# Patient Record
Sex: Male | Born: 1970 | Race: White | Hispanic: No | Marital: Single | Smoking: Current every day smoker
Health system: Southern US, Community
[De-identification: ages and names within clinical notes are randomized; demographics above are authoritative.]

## PROBLEM LIST (undated history)

## (undated) DIAGNOSIS — I219 Acute myocardial infarction, unspecified: Secondary | ICD-10-CM

## (undated) DIAGNOSIS — I10 Essential (primary) hypertension: Secondary | ICD-10-CM

## (undated) HISTORY — PX: HERNIA REPAIR: SHX51

## (undated) HISTORY — PX: KNEE SURGERY: SHX244

---

## 1998-05-26 ENCOUNTER — Emergency Department (HOSPITAL_COMMUNITY): Admission: EM | Admit: 1998-05-26 | Discharge: 1998-05-26 | Payer: Self-pay | Admitting: *Deleted

## 2000-09-11 ENCOUNTER — Encounter: Payer: Self-pay | Admitting: Family Medicine

## 2000-09-11 ENCOUNTER — Encounter: Admission: RE | Admit: 2000-09-11 | Discharge: 2000-09-11 | Payer: Self-pay | Admitting: Family Medicine

## 2014-03-18 ENCOUNTER — Encounter (HOSPITAL_COMMUNITY): Payer: Self-pay | Admitting: Emergency Medicine

## 2014-03-18 ENCOUNTER — Emergency Department (HOSPITAL_COMMUNITY): Payer: BC Managed Care – PPO

## 2014-03-18 ENCOUNTER — Emergency Department (HOSPITAL_COMMUNITY)
Admission: EM | Admit: 2014-03-18 | Discharge: 2014-03-18 | Disposition: A | Discharge status: Home / Self Care | Payer: BC Managed Care – PPO | Attending: Emergency Medicine | Admitting: Emergency Medicine

## 2014-03-18 DIAGNOSIS — Z792 Long term (current) use of antibiotics: Secondary | ICD-10-CM | POA: Insufficient documentation

## 2014-03-18 DIAGNOSIS — M79609 Pain in unspecified limb: Secondary | ICD-10-CM | POA: Insufficient documentation

## 2014-03-18 DIAGNOSIS — K219 Gastro-esophageal reflux disease without esophagitis: Secondary | ICD-10-CM

## 2014-03-18 DIAGNOSIS — F17200 Nicotine dependence, unspecified, uncomplicated: Secondary | ICD-10-CM | POA: Insufficient documentation

## 2014-03-18 DIAGNOSIS — Z23 Encounter for immunization: Secondary | ICD-10-CM | POA: Insufficient documentation

## 2014-03-18 DIAGNOSIS — R079 Chest pain, unspecified: Secondary | ICD-10-CM

## 2014-03-18 LAB — BASIC METABOLIC PANEL
BUN: 11 mg/dL (ref 6–23)
CO2: 21 meq/L (ref 19–32)
Calcium: 9.1 mg/dL (ref 8.4–10.5)
Chloride: 103 mEq/L (ref 96–112)
Creatinine, Ser: 0.8 mg/dL (ref 0.50–1.35)
GFR calc non Af Amer: >90 mL/min (ref >90)
GLUCOSE: 87 mg/dL (ref 70–99)
POTASSIUM: 3.8 meq/L (ref 3.7–5.3)
Sodium: 138 mEq/L (ref 137–147)

## 2014-03-18 LAB — CBC
HEMATOCRIT: 44.2 % (ref 39.0–52.0)
HEMOGLOBIN: 15.3 g/dL (ref 13.0–17.0)
MCH: 33.9 pg (ref 26.0–34.0)
MCHC: 34.6 g/dL (ref 30.0–36.0)
MCV: 98 fL (ref 78.0–100.0)
Platelets: 245 10*3/uL (ref 150–400)
RBC: 4.51 MIL/uL (ref 4.22–5.81)
RDW: 14 % (ref 11.5–15.5)
WBC: 13 10*3/uL — AB (ref 4.0–10.5)

## 2014-03-18 LAB — HEPATIC FUNCTION PANEL
ALT: 22 U/L (ref 0–53)
AST: 19 U/L (ref 0–37)
Albumin: 4 g/dL (ref 3.5–5.2)
Alkaline Phosphatase: 81 U/L (ref 39–117)
BILIRUBIN TOTAL: 0.5 mg/dL (ref 0.3–1.2)
Bilirubin, Direct: <0.2 mg/dL (ref 0.0–0.3)
Total Protein: 6.8 g/dL (ref 6.0–8.3)

## 2014-03-18 LAB — URINALYSIS, ROUTINE W REFLEX MICROSCOPIC
Bilirubin Urine: NEGATIVE
GLUCOSE, UA: NEGATIVE mg/dL
Hgb urine dipstick: NEGATIVE
Ketones, ur: NEGATIVE mg/dL
LEUKOCYTES UA: NEGATIVE
NITRITE: NEGATIVE
Protein, ur: NEGATIVE mg/dL
SPECIFIC GRAVITY, URINE: 1.011 (ref 1.005–1.030)
Urobilinogen, UA: 0.2 mg/dL (ref 0.0–1.0)
pH: 6 (ref 5.0–8.0)

## 2014-03-18 LAB — I-STAT TROPONIN, ED: Troponin i, poc: 0 ng/mL (ref 0.00–0.08)

## 2014-03-18 LAB — TROPONIN I

## 2014-03-18 LAB — PRO B NATRIURETIC PEPTIDE: Pro B Natriuretic peptide (BNP): 14.1 pg/mL (ref 0–125)

## 2014-03-18 LAB — LIPASE, BLOOD: LIPASE: 26 U/L (ref 11–59)

## 2014-03-18 MED ORDER — GI COCKTAIL ~~LOC~~
30.0000 mL | Freq: Once | ORAL | Status: AC
  Administered 2014-03-18: 30 mL via ORAL
  Filled 2014-03-18: qty 30

## 2014-03-18 MED ORDER — TETANUS-DIPHTH-ACELL PERTUSSIS 5-2.5-18.5 LF-MCG/0.5 IM SUSP
0.5000 mL | Freq: Once | INTRAMUSCULAR | Status: AC
  Administered 2014-03-18: 0.5 mL via INTRAMUSCULAR
  Filled 2014-03-18: qty 0.5

## 2014-03-18 MED ORDER — OMEPRAZOLE 20 MG PO CPDR: 20.0000 mg | DELAYED_RELEASE_CAPSULE | Freq: Every day | ORAL | Status: DC

## 2014-03-18 MED ORDER — ASPIRIN 325 MG PO TABS
325.0000 mg | ORAL_TABLET | Freq: Once | ORAL | Status: AC
  Administered 2014-03-18: 325 mg via ORAL
  Filled 2014-03-18: qty 1

## 2014-03-18 MED ORDER — SODIUM CHLORIDE 0.9 % IV BOLUS (SEPSIS)
1000.0000 mL | Freq: Once | INTRAVENOUS | Status: AC
  Administered 2014-03-18: 1000 mL via INTRAVENOUS

## 2014-03-18 MED ORDER — ONDANSETRON HCL 4 MG/2ML IJ SOLN
4.0000 mg | Freq: Once | INTRAMUSCULAR | Status: DC
  Filled 2014-03-18: qty 2

## 2014-03-18 MED ORDER — MORPHINE SULFATE 2 MG/ML IJ SOLN
2.0000 mg | Freq: Once | INTRAMUSCULAR | Status: DC
  Filled 2014-03-18: qty 1

## 2014-03-18 NOTE — ED Notes (Signed)
Spoke with Lab, stated they did not add on bloodwork, will have to redrawn due to time being too late

## 2014-03-18 NOTE — ED Notes (Signed)
Per pt sts chest pain that started last night that has been constant. sts that the pain has gotten worse and is radiating into his arm and having a HA.

## 2014-03-18 NOTE — ED Provider Notes (Signed)
CSN: [id number]     Arrival date & time 03/18/14  1344 History   First MD Initiated Contact with Patient 03/18/14 1506     Chief Complaint  Patient presents with  . Chest Pain     (Consider location/radiation/quality/duration/timing/severity/associated sxs/prior Treatment) The history is provided by the patient. No language interpreter was used.  Brett Odom is a 43 y/o M with no known significant PMHx presenting to the ED with chest pain that started last night while the patient was watching television, around 10:00-10:30PM. Patient erported that the chest pain is localized to the epigastric region and center of the chest described as an intense intermittent burning sensation, described as an "intense heartburn." Patient reported that this afternoon he started to experience left arm tingling and numbness. Reported that he has been feeling nauseous intermittently. Stated that he has history of GERD - reported that when he wakes up in the morning he drinks a [facility] that makes the pain worse and stated that when he drinks water he does better. 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 Denied fevers, shortness of breath, difficulty breathing, neck pain, neck stiffness, diarrhea, melena, hematochezia, diaphoresis, changes in appetite, syncope. Family history of cardiac issues on the maternal side - myocardial infarction when mother was in her 86s-as per patient report. PCP none  History reviewed. No pertinent past medical history. History reviewed. No pertinent past surgical history. History reviewed. No pertinent family history. History  Substance Use Topics  . Smoking status: Current Every Day Smoker  . Smokeless tobacco: Not on file  . Alcohol Use: Yes    Review of Systems  Constitutional: Negative for fever and chills.  Respiratory: Negative for chest tightness and shortness of breath.   Cardiovascular: Positive for chest pain.  Gastrointestinal: Positive for nausea. Negative for  vomiting, abdominal pain, diarrhea, constipation, blood in stool and anal bleeding.  Musculoskeletal: Positive for arthralgias (Left arm).  Neurological: Negative for dizziness, weakness and headaches.  All other systems reviewed and are negative.     Allergies  Codeine  Home Medications   Prior to Admission medications   Medication Sig Start Date End Date Taking? Authorizing Provider  amoxicillin-clavulanate (AUGMENTIN) 875-125 MG per tablet Take 1 tablet by mouth 2 (two) times daily. Started 03/07/14   Yes Historical Provider, MD   BP 150/91  Pulse 61  Temp(Src) 97.9 F (36.6 C) (Oral)  Resp 14  Wt 210 lb (95.255 kg)  SpO2 99% Physical Exam  Nursing note and vitals reviewed. Constitutional: He is oriented to person, place, and time. He appears well-developed and well-nourished. No distress.  HENT:  Head: Normocephalic and atraumatic.  Mouth/Throat: Oropharynx is clear and moist. No oropharyngeal exudate.  Eyes: Conjunctivae and EOM are normal. Pupils are equal, round, and reactive to light. Right eye exhibits no discharge. Left eye exhibits no discharge.  Neck: Normal range of motion. Neck supple. No tracheal deviation present.  Negative neck stiffness Negative nuchal rigidity Negative cervical lymphadenopathy Negative meningeal signs  Cardiovascular: Normal rate, regular rhythm and normal heart sounds.  Exam reveals no friction rub.   No murmur heard. Pulses:      Radial pulses are 2+ on the right side, and 2+ on the left side.       Dorsalis pedis pulses are 2+ on the right side, and 2+ on the left side.  Cap refill less than 3 seconds Negative swelling or pitting edema identified to lower extremities bilaterally  Pulmonary/Chest: Effort normal and breath  sounds normal. No respiratory distress. He has no wheezes. He has no rales. He exhibits tenderness.  Patient is able to speak in full sentences without difficulty Negative use of accessory muscles Negative  stridor  Discomfort upon palpation to the Center for chest - pain reproducible  Abdominal: Soft. Bowel sounds are normal. He exhibits no distension. There is tenderness. There is no rebound and no guarding.  Abdomen with negative distention Soft upon palpation Discomfort upon palpation to the right upper quadrant and epigastric region Negative rigidity or guarding noted Negative peritoneal signs  Musculoskeletal: Normal range of motion.  Full ROM to upper and lower extremities without difficulty noted, negative ataxia noted.  Lymphadenopathy:    He has no cervical adenopathy.  Neurological: He is alert and oriented to person, place, and time. No cranial nerve deficit. He exhibits normal muscle tone. Coordination normal.  Cranial nerves III-XII grossly intact Strength 5+/5+ to upper and lower extremities bilaterally with resistance applied, equal distribution noted Equal grip strength  Skin: Skin is warm and dry. No rash noted. He is not diaphoretic. No erythema.  Psychiatric: He has a normal mood and affect. His behavior is normal. Thought content normal.    ED Course  Procedures (including critical care time)  8:11 PM This provider re-assessed the patient. Reported that the GI cocktail helped a little bit, reported that he still is experiencing discomfort described as a burning, gnawing sensation - rated the discomfort as a 4/10. This provider discussed labs and imaging in great detail with patient and family at bedside-patient understood. Patient reported that he drinks soda, drinks coffee, eats [employer] on a daily basis-discussed with patient that this is not a proper diet and this is most likely GERD related. Patient understood.  Results for orders placed during the hospital encounter of 03/18/14  CBC      Result Value Ref Range   WBC 13.0 (*) 4.0 - 10.5 K/uL   RBC 4.51  4.22 - 5.81 MIL/uL   Hemoglobin 15.3  13.0 - 17.0 g/dL   HCT 40.944.2  81.139.0 - 91.452.0 %   MCV 98.0  78.0 - 100.0 fL    MCH 33.9  26.0 - 34.0 pg   MCHC 34.6  30.0 - 36.0 g/dL   RDW 78.214.0  95.611.5 - 21.315.5 %   Platelets 245  150 - 400 K/uL  BASIC METABOLIC PANEL      Result Value Ref Range   Sodium 138  137 - 147 mEq/L   Potassium 3.8  3.7 - 5.3 mEq/L   Chloride 103  96 - 112 mEq/L   CO2 21  19 - 32 mEq/L   Glucose, Bld 87  70 - 99 mg/dL   BUN 11  6 - 23 mg/dL   Creatinine, Ser 0.860.80  0.50 - 1.35 mg/dL   Calcium 9.1  8.4 - 57.810.5 mg/dL   GFR calc non Af Amer >90  >90 mL/min   GFR calc Af Amer >90  >90 mL/min  LIPASE, BLOOD      Result Value Ref Range   Lipase 26  11 - 59 U/L  URINALYSIS, ROUTINE W REFLEX MICROSCOPIC      Result Value Ref Range   Color, Urine YELLOW  YELLOW   APPearance CLEAR  CLEAR   Specific Gravity, Urine 1.011  1.005 - 1.030   pH 6.0  5.0 - 8.0   Glucose, UA NEGATIVE  NEGATIVE mg/dL   Hgb urine dipstick NEGATIVE  NEGATIVE   Bilirubin Urine NEGATIVE  NEGATIVE   Ketones, ur NEGATIVE  NEGATIVE mg/dL   Protein, ur NEGATIVE  NEGATIVE mg/dL   Urobilinogen, UA 0.2  0.0 - 1.0 mg/dL   Nitrite NEGATIVE  NEGATIVE   Leukocytes, UA NEGATIVE  NEGATIVE  PRO B NATRIURETIC PEPTIDE      Result Value Ref Range   Pro B Natriuretic peptide (BNP) 14.1  0 - 125 pg/mL  HEPATIC FUNCTION PANEL      Result Value Ref Range   Total Protein 6.8  6.0 - 8.3 g/dL   Albumin 4.0  3.5 - 5.2 g/dL   AST 19  0 - 37 U/L   ALT 22  0 - 53 U/L   Alkaline Phosphatase 81  39 - 117 U/L   Total Bilirubin 0.5  0.3 - 1.2 mg/dL   Bilirubin, Direct <1.6  0.0 - 0.3 mg/dL   Indirect Bilirubin NOT CALCULATED  0.3 - 0.9 mg/dL  TROPONIN I      Result Value Ref Range   Troponin I <0.30  <0.30 ng/mL  I-STAT TROPOININ, ED      Result Value Ref Range   Troponin i, poc 0.00  0.00 - 0.08 ng/mL   Comment 3             Labs Review Labs Reviewed  CBC - Abnormal; Notable for the following:    WBC 13.0 (*)    All other components within normal limits  BASIC METABOLIC PANEL  LIPASE, BLOOD  URINALYSIS, ROUTINE W REFLEX MICROSCOPIC   PRO B NATRIURETIC PEPTIDE  HEPATIC FUNCTION PANEL  TROPONIN I  Rosezena Sensor, ED    Imaging Review Dg Chest 2 View  03/18/2014   CLINICAL DATA:  Chest pain  EXAM: CHEST  2 VIEW  COMPARISON:  None.  FINDINGS: Cardiomediastinal silhouette is unremarkable. No acute infiltrate or pleural effusion. No pulmonary edema. Bony thorax is unremarkable.  IMPRESSION: No active cardiopulmonary disease.   Electronically Signed   By: Natasha Mead M.D.   On: 03/18/2014 14:45   US Abdomen Complete  03/18/2014   CLINICAL DATA:  Right upper quadrant pain  EXAM: ULTRASOUND ABDOMEN COMPLETE  COMPARISON:  None.  FINDINGS: Gallbladder:  No gallstones or wall thickening visualized. No sonographic Murphy sign noted.  Common bile duct:  Diameter: 3 mm  Liver:  No focal lesion identified. Within normal limits in parenchymal echogenicity.  IVC:  No abnormality visualized.  Pancreas:  Poorly visualized due to overlying bowel gas.  Spleen:  Size and appearance within normal limits.  Right Kidney:  Length: 13.1 cm. Echogenicity within normal limits. No mass or hydronephrosis visualized.  Left Kidney:  Length: 13.0 cm. Echogenicity within normal limits. No mass or hydronephrosis visualized.  Abdominal aorta:  No aneurysm visualized.  Other findings:  None.  IMPRESSION: Unremarkable abdominal ultrasound.   Electronically Signed   By: Annia Belt M.D.   On: 03/18/2014 17:49     EKG Interpretation   Date/Time:  Friday Mar 18 2014 13:47:21 EDT Ventricular Rate:  78 PR Interval:  154 QRS Duration: 102 QT Interval:  366 QTC Calculation: 417 R Axis:   63 Text Interpretation:  Normal sinus rhythm Normal ECG no previous for  comparison Confirmed by HARRISON  MD, FORREST ([id number]) on 03/18/2014 4:02:38  PM      MDM   Final diagnoses:  GERD (gastroesophageal reflux disease)  Chest pain   Medications  aspirin tablet 325 mg (325 mg Oral Given 03/18/14 1611)  sodium chloride 0.9 % bolus 1,000 mL (0  mLs Intravenous Stopped  03/18/14 1855)  Tdap (BOOSTRIX) injection 0.5 mL (0.5 mLs Intramuscular Given 03/18/14 1655)  gi cocktail (Maalox,Lidocaine,Donnatal) (30 mLs Oral Given 03/18/14 1901)   Filed Vitals:   03/18/14 1645 03/18/14 1830 03/18/14 1842 03/18/14 2000  BP: 132/92 143/83 132/87 150/91  Pulse: 83 68  61  Temp:   97.9 F (36.6 C)   TempSrc:   Oral   Resp: 18 17 14 14   Weight:      SpO2: 99% 96% 98% 99%    EKG noted normal sinus rhythm with heart rate of 78 beats per minute. First troponin negative elevation. Second troponin negative elevation. CBC noted mild elevated white blood cell count 13.0. BMP negative findings-kidney function well. Chest x-ray negative for acute cardiopulmonary disease. Abdominal ultrasound unremarkable-negative gallstones or wall thickening identified. HEART score of 2. TIMI score 0. Well's score for PE 0 - doubt PE. Doubt pancreatitis. Doubt acute cholecystitis or cholangitis. Suspicion to be GERD secondary to patient's poor diet of coffee, carbonated drinks, and processed foods. Patient seen and assessed by attending physician who cleared patient for discharge. Patient not septic appearing. Patient stable, afebrile. Discharged patient. Discharged patient with PPIs. Referred patient to PCP, cardiology, and GI. Discussed with patient proper diet. Discussed with patient to rest and stay hydrated. Discussed with patient that if symptoms do not improve within 24-48 hours that patient is to report back to the ED. Discussed with patient to closely monitor symptoms and if symptoms are to worsen or change to report back to the ED - strict return instructions given.  Patient agreed to plan of care, understood, all questions answered.   Raymon Mutton, PA-C 03/19/14 1038

## 2014-03-18 NOTE — ED Notes (Signed)
Pt transported to US

## 2014-03-18 NOTE — Discharge Instructions (Signed)
Please call your doctor for a followup appointment within 24-48 hours. When you talk to your doctor please let them know that you were seen in the emergency department and have them acquire all of your records so that they can discuss the findings with you and formulate a treatment plan to fully care for your new and ongoing problems. Please call and set-up an appointment with your primary care provider to be seen and re-assessed within 24-48 hours Please call and set-up an appointment with Cardiology and Gastroenterology Please decrease coffee intake, soda intake, pepperoni intake - decrease fat and greasy food intake Please rest and stay hydrated - please drink plenty of water If symptoms do not improve please report back to the ED immediately  Please follow diet below Please continue to monitor symptoms closely and if symptoms are to worsen or change (fever greater than 101, chills, sweating, nausea, vomiting, chest pain, shortness of breath, difficulty breathing, numbness, tingling, loss of sensation, dizziness, fainting, worsening or changes to chest pain, stomach pain) please report back to the ED immediately   Chest Pain (Nonspecific) It is often hard to give a specific diagnosis for the cause of chest pain. There is always a chance that your pain could be related to something serious, such as a heart attack or a blood clot in the lungs. You need to follow up with your caregiver for further evaluation. CAUSES   Heartburn.  Pneumonia or bronchitis.  Anxiety or stress.  Inflammation around your heart (pericarditis) or lung (pleuritis or pleurisy).  A blood clot in the lung.  A collapsed lung (pneumothorax). It can develop suddenly on its own (spontaneous pneumothorax) or from injury (trauma) to the chest.  Shingles infection (herpes zoster virus). The chest wall is composed of bones, muscles, and cartilage. Any of these can be the source of the pain.  The bones can be bruised by  injury.  The muscles or cartilage can be strained by coughing or overwork.  The cartilage can be affected by inflammation and become sore (costochondritis). DIAGNOSIS  Lab tests or other studies, such as X-rays, electrocardiography, stress testing, or cardiac imaging, may be needed to find the cause of your pain.  TREATMENT   Treatment depends on what may be causing your chest pain. Treatment may include:  Acid blockers for heartburn.  Anti-inflammatory medicine.  Pain medicine for inflammatory conditions.  Antibiotics if an infection is present.  You may be advised to change lifestyle habits. This includes stopping smoking and avoiding alcohol, caffeine, and chocolate.  You may be advised to keep your head raised (elevated) when sleeping. This reduces the chance of acid going backward from your stomach into your esophagus.  Most of the time, nonspecific chest pain will improve within 2 to 3 days with rest and mild pain medicine. HOME CARE INSTRUCTIONS   If antibiotics were prescribed, take your antibiotics as directed. Finish them even if you start to feel better.  For the next few days, avoid physical activities that bring on chest pain. Continue physical activities as directed.  Do not smoke.  Avoid drinking alcohol.  Only take over-the-counter or prescription medicine for pain, discomfort, or fever as directed by your caregiver.  Follow your caregiver's suggestions for further testing if your chest pain does not go away.  Keep any follow-up appointments you made. If you do not go to an appointment, you could develop lasting (chronic) problems with pain. If there is any problem keeping an appointment, you must call to reschedule.  SEEK MEDICAL CARE IF:   You think you are having problems from the medicine you are taking. Read your medicine instructions carefully.  Your chest pain does not go away, even after treatment.  You develop a rash with blisters on your  chest. SEEK IMMEDIATE MEDICAL CARE IF:   You have increased chest pain or pain that spreads to your arm, neck, jaw, back, or abdomen.  You develop shortness of breath, an increasing cough, or you are coughing up blood.  You have severe back or abdominal pain, feel nauseous, or vomit.  You develop severe weakness, fainting, or chills.  You have a fever. THIS IS AN EMERGENCY. Do not wait to see if the pain will go away. Get medical help at once. Call your local emergency services (911 in U.S.). Do not drive yourself to the hospital. MAKE SURE YOU:   Understand these instructions.  Will watch your condition.  Will get help right away if you are not doing well or get worse. Document Released: 07/24/2005 Document Revised: 01/06/2012 Document Reviewed: 05/19/2008 [facility] Patient Information 2014 [place], [place]. Diet for Gastroesophageal Reflux Disease, Adult Reflux is when stomach acid flows up into the esophagus. The esophagus becomes irritated and sore (inflammation). When reflux happens often and is severe, it is called gastroesophageal reflux disease (GERD). What you eat can help ease any discomfort caused by GERD. FOODS OR DRINKS TO AVOID OR LIMIT  Coffee and black tea, with or without caffeine.  Bubbly (carbonated) drinks with caffeine or energy drinks.  Strong spices, such as pepper, cayenne pepper, curry, or chili powder.  Peppermint or spearmint.  Chocolate.  High-fat foods, such as meats, fried food, oils, butter, or nuts.  Fruits and vegetables that cause discomfort. This includes citrus fruits and tomatoes.  Alcohol. If a certain food or drink irritates your GERD, avoid eating or drinking it. THINGS THAT MAY HELP GERD INCLUDE:  Eat meals slowly.  Eat 5 to 6 small meals a day, not 3 large meals.  Do not eat food for a certain amount of time if it causes discomfort.  Wait 3 hours after eating before lying down.  Keep the head of your bed raised 6 to 9 inches  (15 23 centimeters). Put a foam wedge or blocks under the legs of the bed.  Stay active. Weight loss, if needed, may help ease your discomfort.  Wear loose-fitting clothing.  Do not smoke or chew tobacco. Document Released: 04/14/2012 Document Reviewed: 04/14/2012 [facility] Patient Information 2014 [place], [place]. Gastroesophageal Reflux Disease, Adult Gastroesophageal reflux disease (GERD) happens when acid from your stomach flows up into the esophagus. When acid comes in contact with the esophagus, the acid causes soreness (inflammation) in the esophagus. Over time, GERD may create small holes (ulcers) in the lining of the esophagus. CAUSES   Increased body weight. This puts pressure on the stomach, making acid rise from the stomach into the esophagus.  Smoking. This increases acid production in the stomach.  Drinking alcohol. This causes decreased pressure in the lower esophageal sphincter (valve or ring of muscle between the esophagus and stomach), allowing acid from the stomach into the esophagus.  Late evening meals and a full stomach. This increases pressure and acid production in the stomach.  A malformed lower esophageal sphincter. Sometimes, no cause is found. SYMPTOMS   Burning pain in the lower part of the mid-chest behind the breastbone and in the mid-stomach area. This may occur twice a week or more often.  Trouble swallowing.  Sore  throat.  Dry cough.  Asthma-like symptoms including chest tightness, shortness of breath, or wheezing. DIAGNOSIS  Your caregiver may be able to diagnose GERD based on your symptoms. In some cases, X-rays and other tests may be done to check for complications or to check the condition of your stomach and esophagus. TREATMENT  Your caregiver may recommend over-the-counter or prescription medicines to help decrease acid production. Ask your caregiver before starting or adding any new medicines.  HOME CARE INSTRUCTIONS   Change the  factors that you can control. Ask your caregiver for guidance concerning weight loss, quitting smoking, and alcohol consumption.  Avoid foods and drinks that make your symptoms worse, such as:  Caffeine or alcoholic drinks.  Chocolate.  Peppermint or mint flavorings.  Garlic and onions.  Spicy foods.  Citrus fruits, such as oranges, lemons, or limes.  Tomato-based foods such as sauce, chili, salsa, and pizza.  Fried and fatty foods.  Avoid lying down for the 3 hours prior to your bedtime or prior to taking a nap.  Eat small, frequent meals instead of large meals.  Wear loose-fitting clothing. Do not wear anything tight around your waist that causes pressure on your stomach.  Raise the head of your bed 6 to 8 inches with wood blocks to help you sleep. Extra pillows will not help.  Only take over-the-counter or prescription medicines for pain, discomfort, or fever as directed by your caregiver.  Do not take aspirin, ibuprofen, or other nonsteroidal anti-inflammatory drugs (NSAIDs). SEEK IMMEDIATE MEDICAL CARE IF:   You have pain in your arms, neck, jaw, teeth, or back.  Your pain increases or changes in intensity or duration.  You develop nausea, vomiting, or sweating (diaphoresis).  You develop shortness of breath, or you faint.  Your vomit is green, yellow, black, or looks like coffee grounds or blood.  Your stool is red, bloody, or black. These symptoms could be signs of other problems, such as heart disease, gastric bleeding, or esophageal bleeding. MAKE SURE YOU:   Understand these instructions.  Will watch your condition.  Will get help right away if you are not doing well or get worse. Document Released: 07/24/2005 Document Revised: 01/06/2012 Document Reviewed: 05/03/2011 [facility] Patient Information 2014 [place], [place].   Emergency Department Resource Guide 1) Find a Doctor and Pay Out of Pocket Although you won't have to find out who is covered by  your insurance plan, it is a good idea to ask around and get recommendations. You will then need to call the office and see if the doctor you have chosen will accept you as a new patient and what types of options they offer for patients who are self-pay. Some doctors offer discounts or will set up payment plans for their patients who do not have insurance, but you will need to ask so you aren't surprised when you get to your appointment.  2) Contact Your Local Health Department Not all health departments have doctors that can see patients for sick visits, but many do, so it is worth a call to see if yours does. If you don't know where your local health department is, you can check in your phone book. The CDC also has a tool to help you locate your state's health department, and many state websites also have listings of all of their local health departments.  3) Find a Walk-in Clinic If your illness is not likely to be very severe or complicated, you may want to try a walk in clinic.  These are popping up all over the country in pharmacies, drugstores, and shopping centers. They're usually staffed by nurse practitioners or physician assistants that have been trained to treat common illnesses and complaints. They're usually fairly quick and inexpensive. However, if you have serious medical issues or chronic medical problems, these are probably not your best option.  No Primary Care Doctor: - Call Health Connect at  [phone] - they can help you locate a primary care doctor that  accepts your insurance, provides certain services, etc. - Physician Referral Service- [phone]  Chronic Pain Problems: Organization         Address  Phone   Notes  [facility]  [phone]) [phone] Patients need to be referred by their primary care doctor.   Medication Assistance: Organization         Address  Phone   Notes  [facility] Medication [facility] [address] [place], [place] [address] [phone] --Must be a resident of [facility] [place] -- Must have NO insurance coverage whatsoever (no Medicaid/ Medicare, etc.) -- The pt. MUST have a primary care doctor that directs their care regularly and follows them in the community   MedAssist  [phone]   [employer]  [phone]) [phone]    Agencies that provide inexpensive medical care: Organization         Address  Phone   Notes  [facility]  [phone]) [phone]   [facility]    [phone]) [phone]   [facility] [address] [place], [place] [address] [phone]) [phone]   [facility] [address], [place] [phone]   [facility]anned Parenthood    [phone]   [facility]    [phone]) [phone]   [facility]  [address], [place] Phone:  [phone], Fax:  [phone] Hours of Operation:  9 am - 6 pm, M-F.  Also accepts Medicaid/Medicare and self-pay.  [facility]  [address], [place] Phone: [phone]) [phone], Fax: [phone]) [phone]. Hours of Operation:  8:30 am - 5:30 pm, M-F.  Also accepts Medicaid and self-pay.  [facility] [place] [address], [place] Phone: [phone]   [facility] [address], [place], [place] [phone], Ext. [phone] Mondays & Thursdays: 7-9 AM.  First 15 patients are seen on a first come, first serve basis.    Medicaid-accepting [facility] Providers:  Organization         Address  Phone   Notes  [facility] [address], [place] [phone] Also accepts self-pay patients.  [facility] [address], [place]  [phone]   [facility] [address], [place] [phone]) [phone]   [facility] [address] [place] [address], [place] [phone]   Renaye RakersVeita Bland [address], [place]   [phone] Only accepts [place] Access [place] patients after they have their name applied to their card.   Self-Pay (no insurance) in [facility] [place]:  Organization         Address  Phone   Notes  Sickle Cell Patients, [facility] [address], [place] [phone]   [facility] [address], [place] [phone]   [facility]  [place]  [address], [place] [phone]   [facility]/Dr. Osei-Bonsu  [address] [place] [address], [place] or [address], [place] [phone]) [phone] Phone number for both [place] and [place] locations is the same.  [facility] [address], [place] [phone]) [phone]   [facility] [place] [address] [place] [address], [place] or [address] [phone]   [facility] [address], [place] [phone], phone; [phone]) [phone], fax Sees patients 1st and 3rd Saturday of every month.  Must not qualify for public or private insurance (i.e. Medicaid, Medicare, [place] Health Choice, Veterans' Benefits)  Household income should be no more than 200% of the poverty level The clinic cannot treat you if you are pregnant or think you are pregnant  Sexually transmitted diseases are not treated at the clinic.    Dental Care: Organization         Address  Phone  Notes  [facility] Department of [facility] [address], [place] [phone]) [phone] Accepts children up to age 42 who are enrolled in [place] or [place] Health Choice; pregnant women with a Medicaid card; and children who have applied for Medicaid or [place] Health Choice, but were declined, whose parents can pay a reduced fee at time of service.  [facility] of [facility] [place]  [address], [place] [phone]) [phone] Accepts children up to age 42 who are enrolled  in [place] or [place] Health Choice; pregnant women with a Medicaid card; and children who have applied for Medicaid or [place] Health Choice, but were declined, whose parents can pay a reduced fee at time of service.  [facility]  [address], [place] [phone] Patients are seen by appointment only. Walk-ins are not accepted. [facility] will see patients 42 years of age and older. Monday - Tuesday (8am-5pm) Most Wednesdays (8:30-5pm) $30 per visit, cash only  [facility]  [address], [facility] [phone] Patients are seen by appointment only. Walk-ins are not accepted. [facility] will see patients 42 years of age and older. One Wednesday Evening (Monthly: Volunteer Based).  $30 per visit, cash only  [facility]  [phone] for adults; Children under age 604, call Graduate Pediatric Dentistry at [phone]. Children aged 204-14, please call [phone] to request a pediatric application.  Dental services are provided in all areas of dental care including fillings, crowns and bridges, complete and partial dentures, implants, gum treatment, root canals, and extractions. Preventive care is also provided. Treatment is provided to both adults and children. Patients are selected via a lottery and there is often a waiting list.   [facility] [address], [place]  [phone] [email or web address]   [facility] [address], [place], [place] [phone], Ext. [phone] Second and Fourth Thursday of each month, opens at 6:30 AM; Clinic ends at 9 AM.  Patients are seen on a first-come first-served basis, and a limited number are seen during each clinic.   [facility]  [address], [place], [place] [phone]) [phone]   Eligibility Requirements You must have lived in [place], [place], or [place] counties for at least the last three months.   You cannot be  eligible for state or federal sponsored National Cityhealthcare insurance, including [employer]  Administration, Medicaid, or Medicare.   You generally cannot be eligible for healthcare insurance through your employer.    How to apply: Eligibility screenings are held every Tuesday and Wednesday afternoon from 1:00 pm until 4:00 pm. You do not need an appointment for the interview!  [facility] [address] [place], [place] [phone]   [facility] Health Department  [phone]   [place] [facility] [facility] Health Department  [phone]   [facility] Health Department  [phone]    Behavioral Health Resources in the Community: Intensive Outpatient Programs Organization         Address  Phone  Notes  [facility] [address] [place], [place] [phone]   [facility] [address], [place], [place] [phone]   [facility]: [facility] [address], [place], [place]  [phone]   [facility] Mental [facility] [address],  [place], [place] [phone] or [phone]   Substance Abuse Resources Organization         Address  Phone  Notes  Alcohol and Drug Services  [phone]   [facility]  [phone]   [facility] [place]  [phone]   Floydene Flock  [phone]   Residential & Outpatient Substance Abuse Program  [phone]   Psychological Services Organization         Address  Phone  Notes  [facility]  [phone]   [facility]  [phone]   [facility] Mental Health [address], [place] [phone] or [phone]    Mobile Crisis Teams Organization         Address  Phone  Notes  [facility], Mobile Crisis Care Unit  [phone]   [facility]  [address] [place], [place] [phone]   Doristine Locks [address] [place] [phone]    Self-Help/Support Groups Organization          Address  Phone             Notes  [facility] [place] - variety of support groups  [phone]- I7437963 Call for more information  Narcotics Anonymous (NA), [facility] [address], [employer] [place]  2 meetings at this location   Statistician         Address  Phone  Notes  [facility] [address],    [place]  [phone]   [facility]  [address], [place] [address], [place], [place] [phone]   [facility] [address] [place], [place] [phone] Admissions: 8am-3pm M-F  [facility] [address],    [place], [place] [phone]   [facility] [address] [place], [place], [place] [phone]   [facility] [address]  [place], [place] [phone]   [facility] - Intensive Outpatient [address], [place], [place] [phone]   [facility] ([facility]iction [facility] [address]  [place], [place] [phone] or [phone]   [facility] ([facility]) [address], [place], [place] [phone] Accepts Medicaid  [facility] [address],  [place] [phone] Substance Abuse/Addiction Treatment   [facility] Organization         Address  Phone  Notes  [facility]  [phone]   Angie Fava, PhD [address] [place], [place]   [phone] or [phone]  [facility]   [address] [place], [place] [phone]   [facility] [facility] [address], [place], [place] [phone] Insurance/Medicaid/sponsorship through [facility] [address], [address]                                    [place], [place] [phone] Therapy/tele-psych/case  [facility] [address]   [place], [place] [phone]    Dr. Lolly Mustache  [phone]   [facility]  [employer]  [facility] Dept. 1) [address], [place] 2) [address], [place] 3)  [address], [place] [phone]  [phone]   [facility] Child Abuse Hotline [phone] or [phone] (After Hours)

## 2014-03-18 NOTE — ED Notes (Signed)
Spoke with lab, stated they would add on blood work.

## 2014-03-18 NOTE — ED Notes (Signed)
Patient returned from US.

## 2014-03-18 NOTE — ED Notes (Signed)
IV removed from right hand   Cath intact.  Patient stated he is ready to go.

## 2014-03-19 NOTE — ED Provider Notes (Signed)
Medical screening examination/treatment/procedure(s) were conducted as a shared visit with non-physician practitioner(s) and myself.  I personally evaluated the patient during the encounter.   EKG Interpretation   Date/Time:  Friday Mar 18 2014 13:47:21 EDT Ventricular Rate:  78 PR Interval:  154 QRS Duration: 102 QT Interval:  366 QTC Calculation: 417 R Axis:   63 Text Interpretation:  Normal sinus rhythm Normal ECG no previous for  comparison Confirmed by Eugenie Harewood  MD, Amyria Komar ([id number]) on 03/18/2014 4:02:38  PM      I interviewed and examined the patient. Lungs are CTAB. Cardiac exam wnl. Abdomen soft w/ mild ttp of epig/RUQ area.  Pain atypical for cardiac, suspicious for gi cause. Labs/US non-contrib. Pt continues to appear well. Delta trop neg. Low risk for MACE per HEART score. Will start ppi vs H2 blocker. Will rec f/u w/ pcp.   Junius Argyle, MD 03/19/14 1233

## 2014-11-30 IMAGING — CR DG CHEST 2V
2 series · 2 of 2 positions shown · non-contrast
Comparison: None.

CLINICAL DATA: Chest pain

EXAM:
CHEST  2 VIEW

[w chest pa]
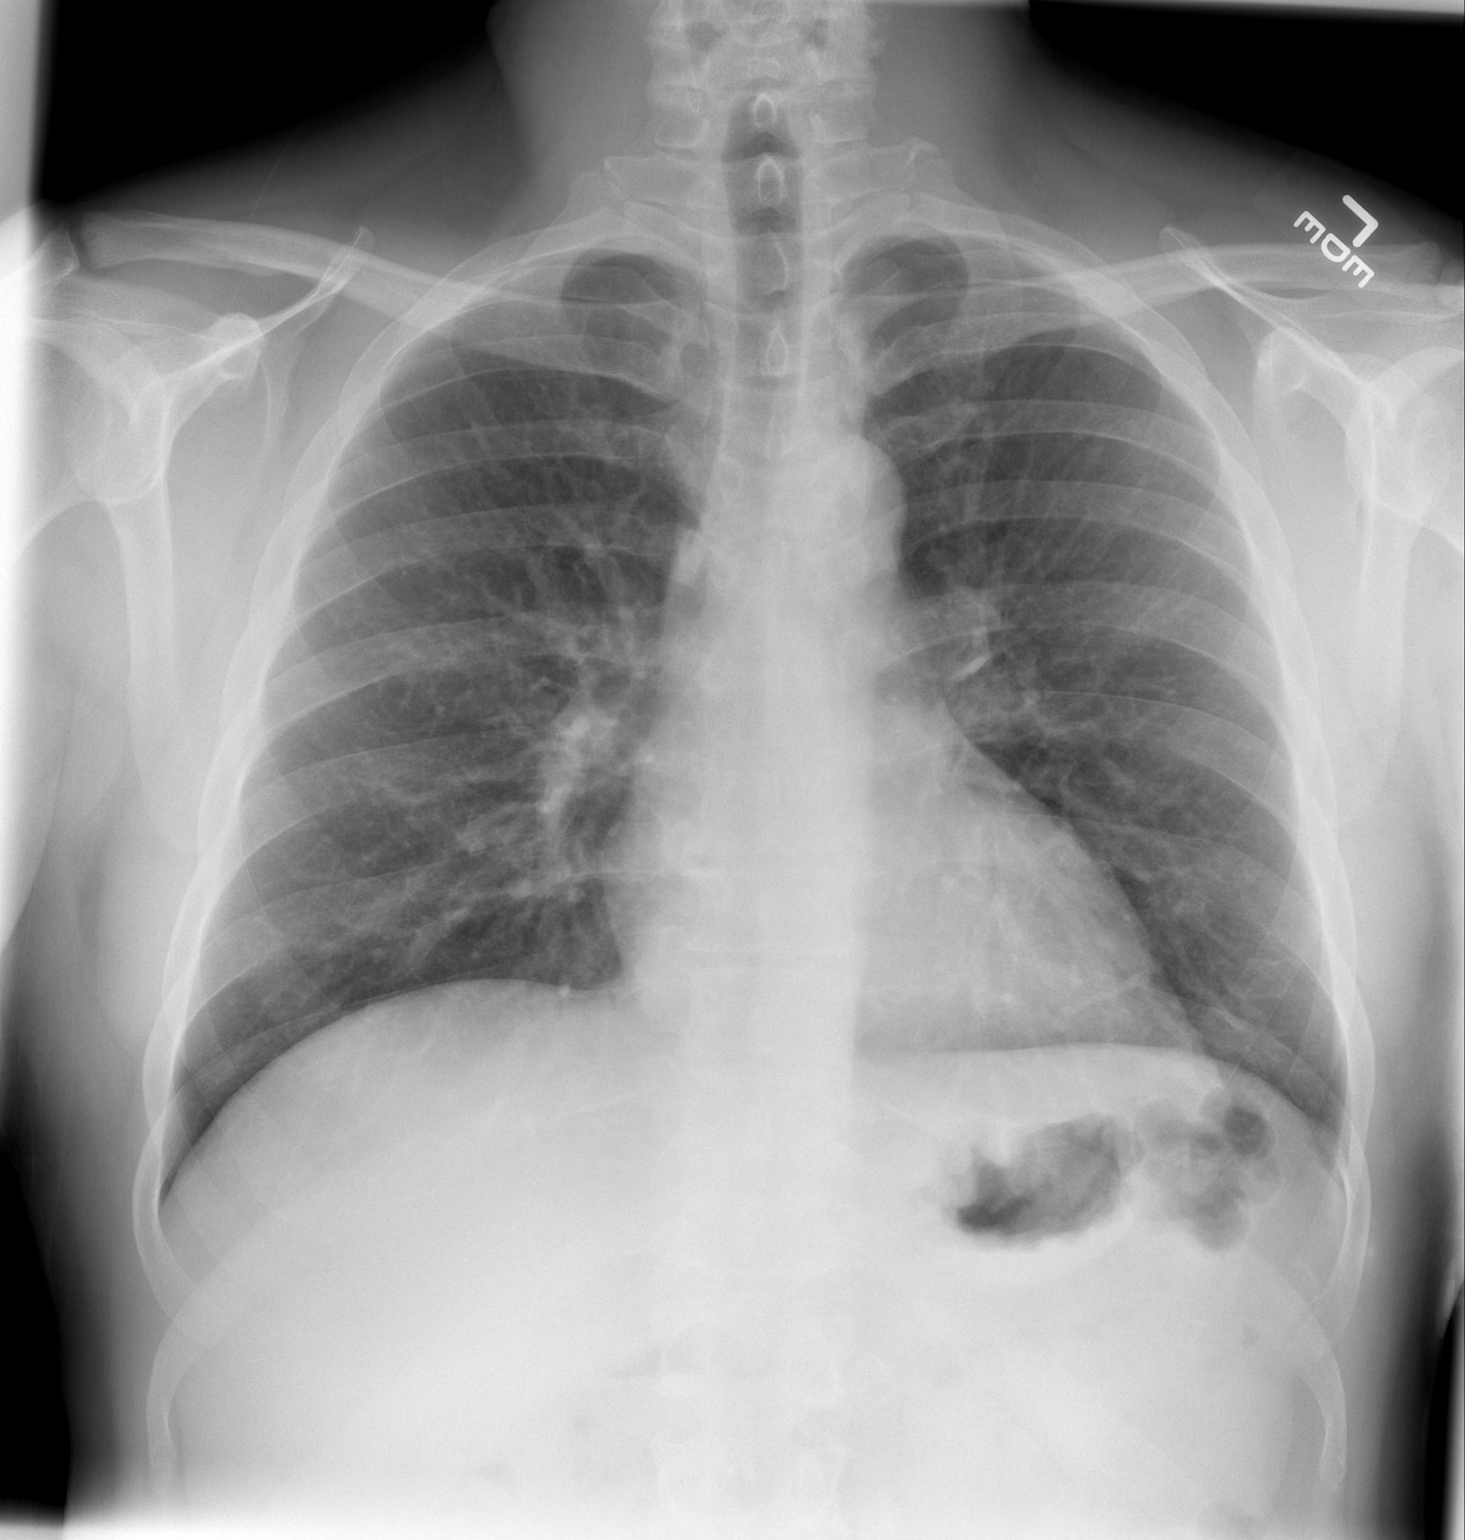

[w chest lat]
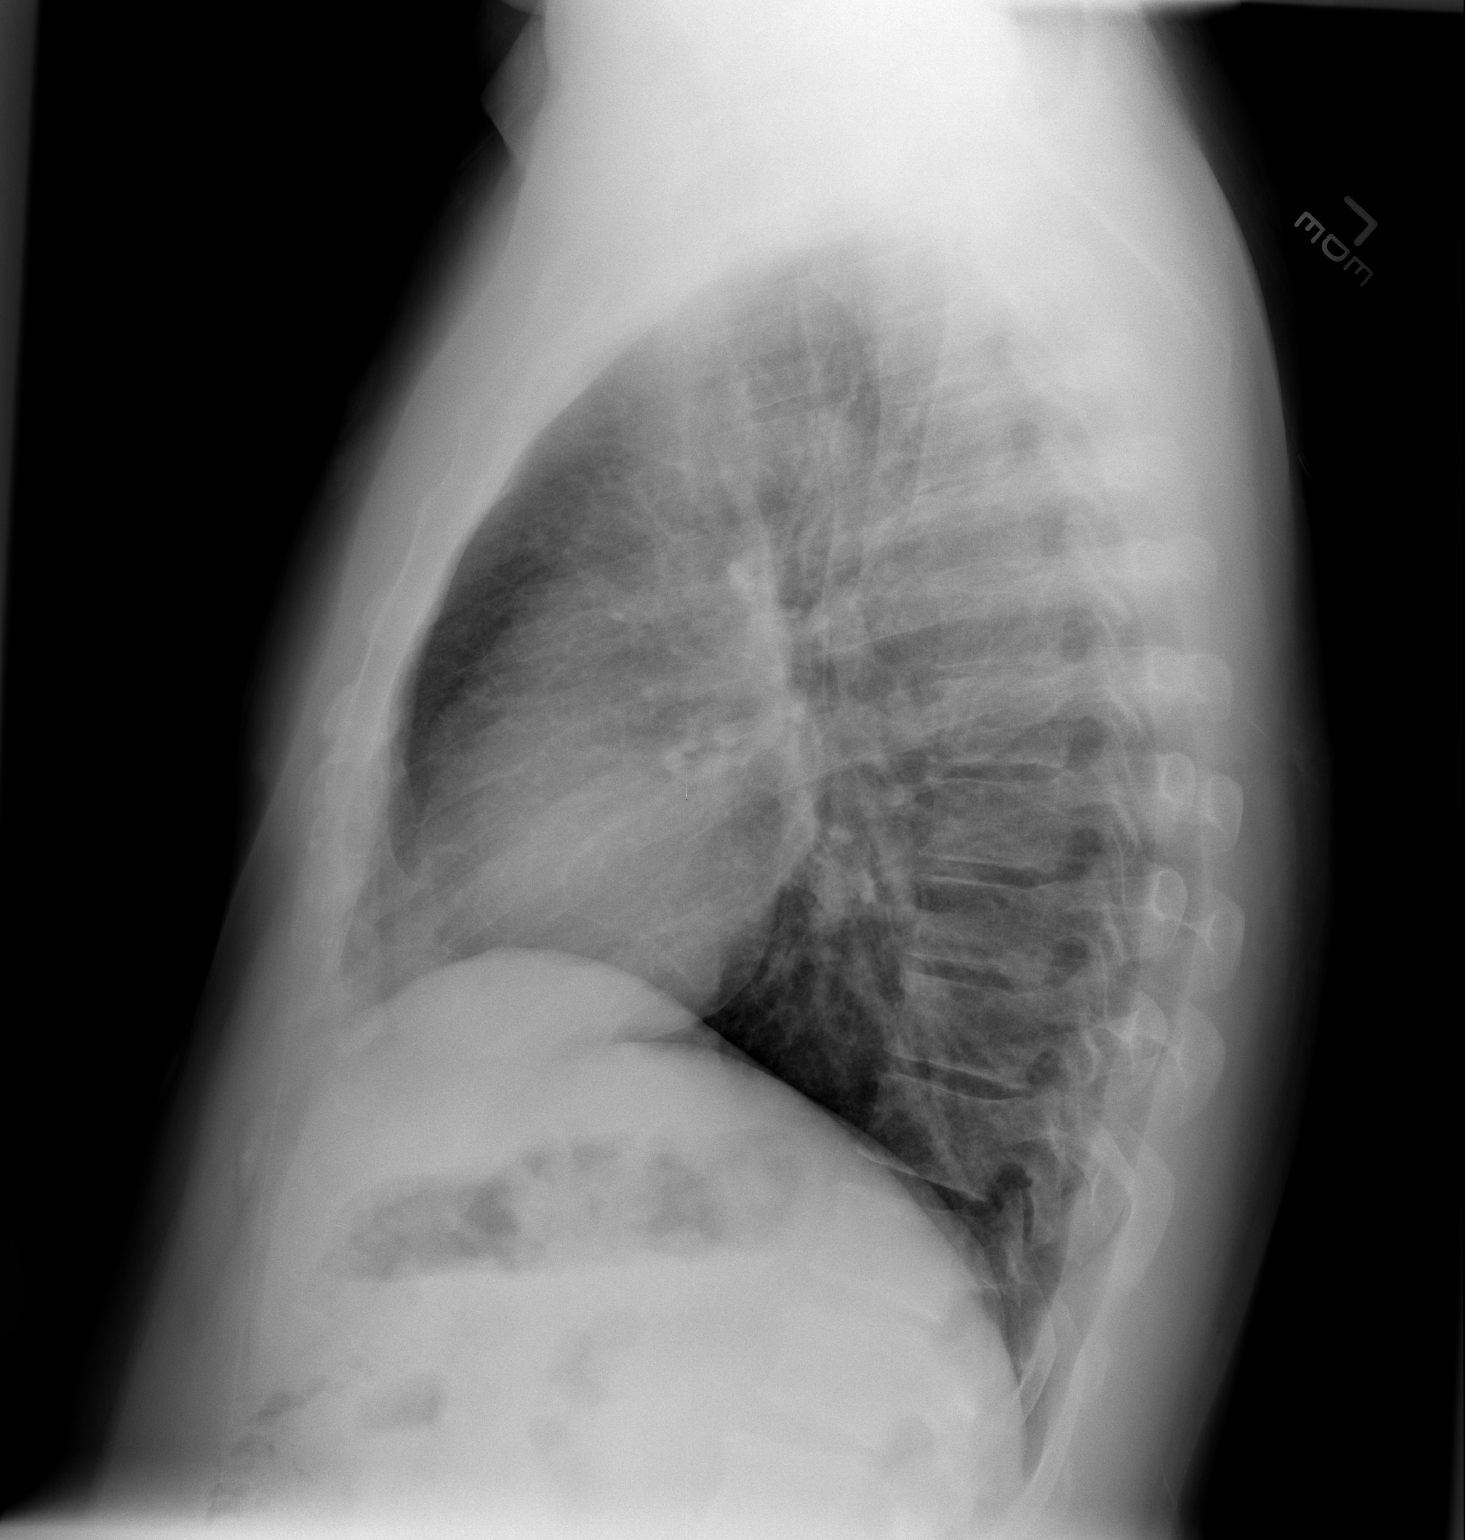

[2 of 2 positions shown; findings below may reference images not displayed]

FINDINGS: Cardiomediastinal silhouette is unremarkable. No acute infiltrate or
pleural effusion. No pulmonary edema. Bony thorax is unremarkable.
IMPRESSION: No active cardiopulmonary disease.

## 2021-01-07 ENCOUNTER — Emergency Department (HOSPITAL_COMMUNITY): Payer: 59

## 2021-01-07 ENCOUNTER — Emergency Department (HOSPITAL_COMMUNITY)
Admission: EM | Admit: 2021-01-07 | Discharge: 2021-01-07 | Disposition: A | Discharge status: Home / Self Care | Payer: 59 | Attending: Emergency Medicine | Admitting: Emergency Medicine

## 2021-01-07 ENCOUNTER — Other Ambulatory Visit: Payer: Self-pay

## 2021-01-07 ENCOUNTER — Encounter (HOSPITAL_COMMUNITY): Payer: Self-pay | Admitting: Emergency Medicine

## 2021-01-07 DIAGNOSIS — I209 Angina pectoris, unspecified: Secondary | ICD-10-CM

## 2021-01-07 DIAGNOSIS — M25512 Pain in left shoulder: Secondary | ICD-10-CM | POA: Diagnosis not present

## 2021-01-07 DIAGNOSIS — F17210 Nicotine dependence, cigarettes, uncomplicated: Secondary | ICD-10-CM | POA: Insufficient documentation

## 2021-01-07 DIAGNOSIS — R079 Chest pain, unspecified: Secondary | ICD-10-CM | POA: Diagnosis present

## 2021-01-07 DIAGNOSIS — I10 Essential (primary) hypertension: Secondary | ICD-10-CM | POA: Diagnosis not present

## 2021-01-07 HISTORY — DX: Acute myocardial infarction, unspecified: I21.9

## 2021-01-07 HISTORY — DX: Essential (primary) hypertension: I10

## 2021-01-07 LAB — BASIC METABOLIC PANEL
Anion gap: 9 (ref 5–15)
BUN: 10 mg/dL (ref 6–20)
CO2: 22 mmol/L (ref 22–32)
Calcium: 9 mg/dL (ref 8.9–10.3)
Chloride: 102 mmol/L (ref 98–111)
Creatinine, Ser: 0.85 mg/dL (ref 0.61–1.24)
GFR, Estimated: >60 mL/min (ref >60)
Glucose, Bld: 96 mg/dL (ref 70–99)
Potassium: 3.6 mmol/L (ref 3.5–5.1)
Sodium: 133 mmol/L — ABNORMAL LOW (ref 135–145)

## 2021-01-07 LAB — CBC
HCT: 44.4 % (ref 39.0–52.0)
Hemoglobin: 15.3 g/dL (ref 13.0–17.0)
MCH: 33.3 pg (ref 26.0–34.0)
MCHC: 34.5 g/dL (ref 30.0–36.0)
MCV: 96.7 fL (ref 80.0–100.0)
Platelets: 329 10*3/uL (ref 150–400)
RBC: 4.59 MIL/uL (ref 4.22–5.81)
RDW: 12.6 % (ref 11.5–15.5)
WBC: 13.8 10*3/uL — ABNORMAL HIGH (ref 4.0–10.5)
nRBC: 0 % (ref 0.0–0.2)

## 2021-01-07 LAB — TROPONIN I (HIGH SENSITIVITY)
Troponin I (High Sensitivity): 9 ng/L (ref <18)
Troponin I (High Sensitivity): 11 ng/L (ref <18)

## 2021-01-07 NOTE — ED Provider Notes (Signed)
[facility] EMERGENCY DEPARTMENT Provider Note   CSN: [id number] Arrival date & time: 01/07/21  1718     History Chief Complaint  Patient presents with  . Shoulder Pain    Jaw pain    Brett Odom is a 50 y.o. male.  HPI  HPI: A 50 year old patient presents for evaluation of chest pain. Initial onset of pain was approximately 1-3 hours ago. The patient's chest pain is not worse with exertion. The patient's chest pain is not middle- or left-sided, is not well-localized, is not described as heaviness/pressure/tightness, is not sharp and does radiate to the arms/jaw/neck. The patient does not complain of nausea and denies diaphoresis. The patient has smoked in the past 90 days. The patient has no history of stroke, has no history of peripheral artery disease, denies any history of treated diabetes, has no relevant family history of coronary artery disease (first degree relative at less than age 46), is not hypertensive, has no history of hypercholesterolemia and does not have an elevated BMI (>=30).   50 year old male with history of nonobstructive CAD comes in with chief complaint of shoulder pain.  He reports left-sided shoulder pain radiating to the jaw while playing pool.  His pain lasted about 40 minutes.  He is currently pain-free.  He took aspirin prior to ED arrival.  Patient smokes about a pack a day.  He used to smoke heavier before.  In 2016 he had severe chest pain and was transferred to [facility], where catheterization revealed nonocclusive CAD and vasospasms.  He has had stress test the year after and it was negative.  Past Medical History:  Diagnosis Date  . Hypertension   . Myocardial infarction (HCC)     There are no problems to display for this patient.   Past Surgical History:  Procedure Laterality Date  . HERNIA REPAIR    . KNEE SURGERY         No family history on file.  Social History   Tobacco Use  . Smoking status: Current  Every Day Smoker  . Smokeless tobacco: Never Used  Substance Use Topics  . Alcohol use: Yes  . Drug use: Not Currently    Home Medications Prior to Admission medications   Medication Sig Start Date End Date Taking? Authorizing Provider  amoxicillin-clavulanate (AUGMENTIN) 875-125 MG per tablet Take 1 tablet by mouth 2 (two) times daily. Started 03/07/14    [provider]  omeprazole (PRILOSEC) 20 MG capsule Take 1 capsule (20 mg total) by mouth daily. 03/18/14   Sciacca, Marissa, PA-C    Allergies    Codeine  Review of Systems   Review of Systems  Constitutional: Positive for activity change.  Respiratory: Negative for shortness of breath.   Cardiovascular: Negative for chest pain.  Gastrointestinal: Negative for nausea and vomiting.  Neurological: Negative for dizziness.  All other systems reviewed and are negative.   Physical Exam Updated Vital Signs BP 95/69   Pulse 77   Temp 97.6 F (36.4 C)   Resp 16   SpO2 98%   Physical Exam Vitals and nursing note reviewed.  Constitutional:      Appearance: He is well-developed.  HENT:     Head: Atraumatic.  Cardiovascular:     Rate and Rhythm: Normal rate.     Heart sounds: Normal heart sounds. No murmur heard.     Comments: 2+ femoral and radial pulse thAT is equal Pulmonary:     Effort: Pulmonary effort is  normal.  Musculoskeletal:     Cervical back: Neck supple.  Skin:    General: Skin is warm.  Neurological:     Mental Status: He is alert and oriented to person, place, and time.     ED Results / Procedures / Treatments   Labs (all labs ordered are listed, but only abnormal results are displayed) Labs Reviewed  BASIC METABOLIC PANEL - Abnormal; Notable for the following components:      Result Value   Sodium 133 (*)    All other components within normal limits  CBC - Abnormal; Notable for the following components:   WBC 13.8 (*)    All other components within normal limits  TROPONIN I (HIGH  SENSITIVITY)  TROPONIN I (HIGH SENSITIVITY)    EKG EKG Interpretation  Date/Time:  Sunday January 07 2021 17:25:29 EDT Ventricular Rate:  78 PR Interval:  166 QRS Duration: 102 QT Interval:  374 QTC Calculation: 426 R Axis:   28 Text Interpretation: Normal sinus rhythm Cannot rule out Anterior infarct , age undetermined Abnormal ECG No acute changes No significant change since last tracing Confirmed by Haddy Mullinax ([id number]) on 01/07/2021 6:57:29 PM   Radiology DG Chest 2 View  Result Date: 01/07/2021 CLINICAL DATA:  Chest pain and shortness of breath. EXAM: CHEST - 2 VIEW COMPARISON:  03/18/2014 FINDINGS: The cardiac silhouette, mediastinal and hilar contours are within normal limits. The lungs are clear. No pleural effusions or pneumothorax. Rounded density in the left lung base could represent the nipple shadow artifact from EKG leads. A repeat PA chest film with nipple markers and the EKG leads removed may be helpful. The bony thorax is intact. IMPRESSION: No acute cardiopulmonary findings. Left lower lobe nodular density as above. Electronically Signed   By: P.  Gallerani M.D.   On: 01/07/2021 17:59    Procedures Procedures   Medications Ordered in ED Medications - No data to display  ED Course  I have reviewed the triage vital signs and the nursing notes.  Pertinent labs & imaging results that were available during my care of the patient were reviewed by me and considered in my medical decision making (see chart for details).    MDM Rules/Calculators/A&P HEAR Score: 3                        49  year old comes in a chief complaint of shoulder pain radiating to the jaw.  The episode lasted for about 30 minutes.  He has been pain-free since ER arrival.  He took aspirin prior to ED arrival.  It appears that he has history of coronary vasospasms and nonobstructive CAD.  Negative stress test in 2016.  Vascular exam was reassuring.  Dissection considered in the differential, but  given that he is symptom-free, there is no tachycardia or hypertension and the vascular exam is normal -we do not think he is having aortic acute dissection.  ACS is really high on the differential given the fairly typical sounding pain.  Troponins ordered.  Initial HST and is below the institutional cutoff.  Repeat ordered, if negative and patient remains chest pain-free then he will be discharged with cardiology follow-up.  I reassessed the patient at 9 PM.  His second troponin is still pending.  His care will be signed out to 2017, APP  Final Clinical Impression(s) / ED Diagnoses Final diagnoses:  Angina pectoris (HCC)    Rx / DC Orders ED Discharge Orders    None  Derwood Kaplan, MD 01/07/21 2119

## 2021-01-07 NOTE — ED Triage Notes (Signed)
Pt reports 4/10 L shoulder and L jaw pain this afternoon that resolved after taking 4 baby ASA.  States he was drinking and playing pool when it happened.  He was told that he turned "white".  Denies any symptoms at present.

## 2021-01-07 NOTE — Discharge Instructions (Addendum)

## 2021-09-21 IMAGING — DX DG CHEST 2V
2 series · 2 of 2 positions shown · non-contrast
Comparison: 03/18/2014

CLINICAL DATA: Chest pain and shortness of breath.

EXAM:
CHEST - 2 VIEW

[chest pa]
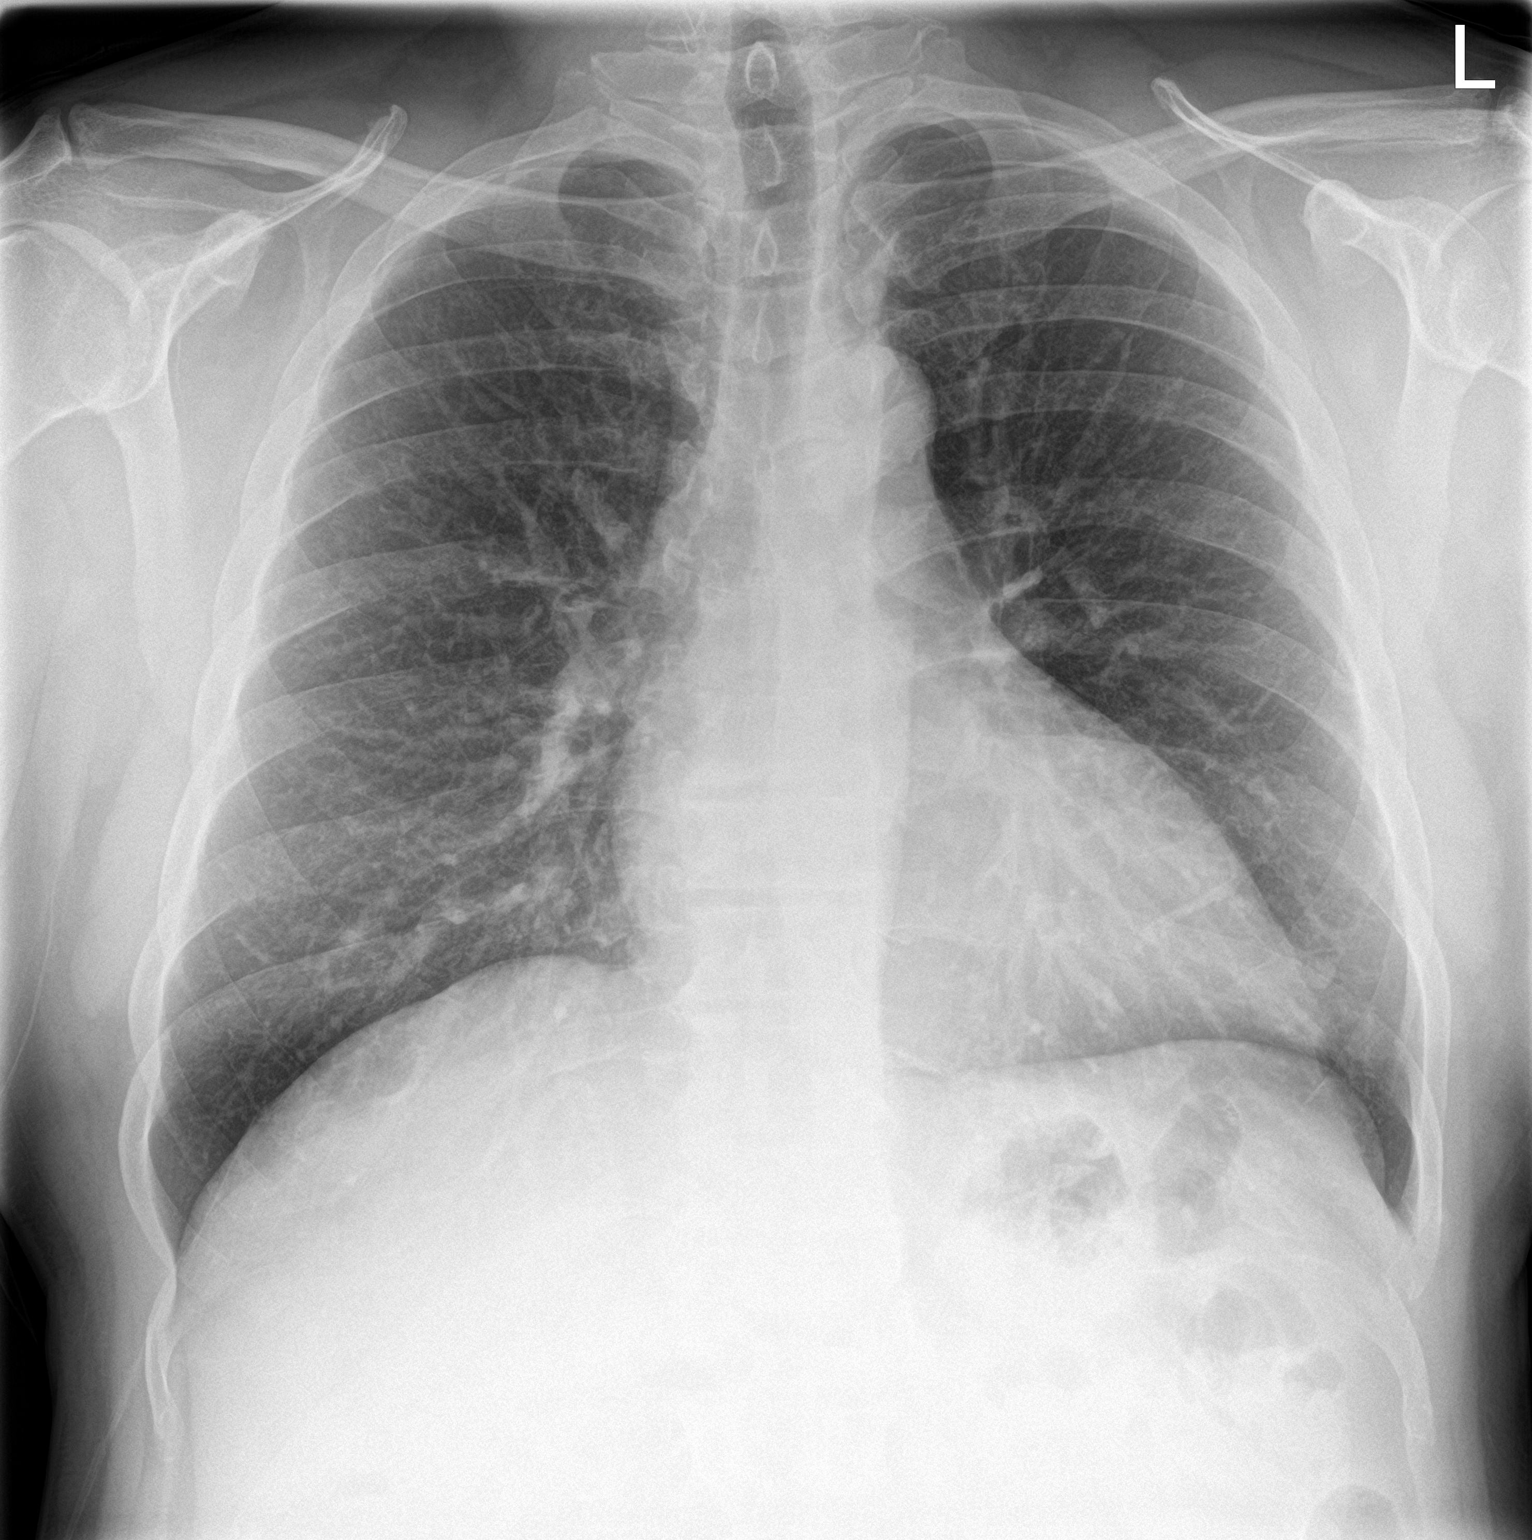

[chest lat]
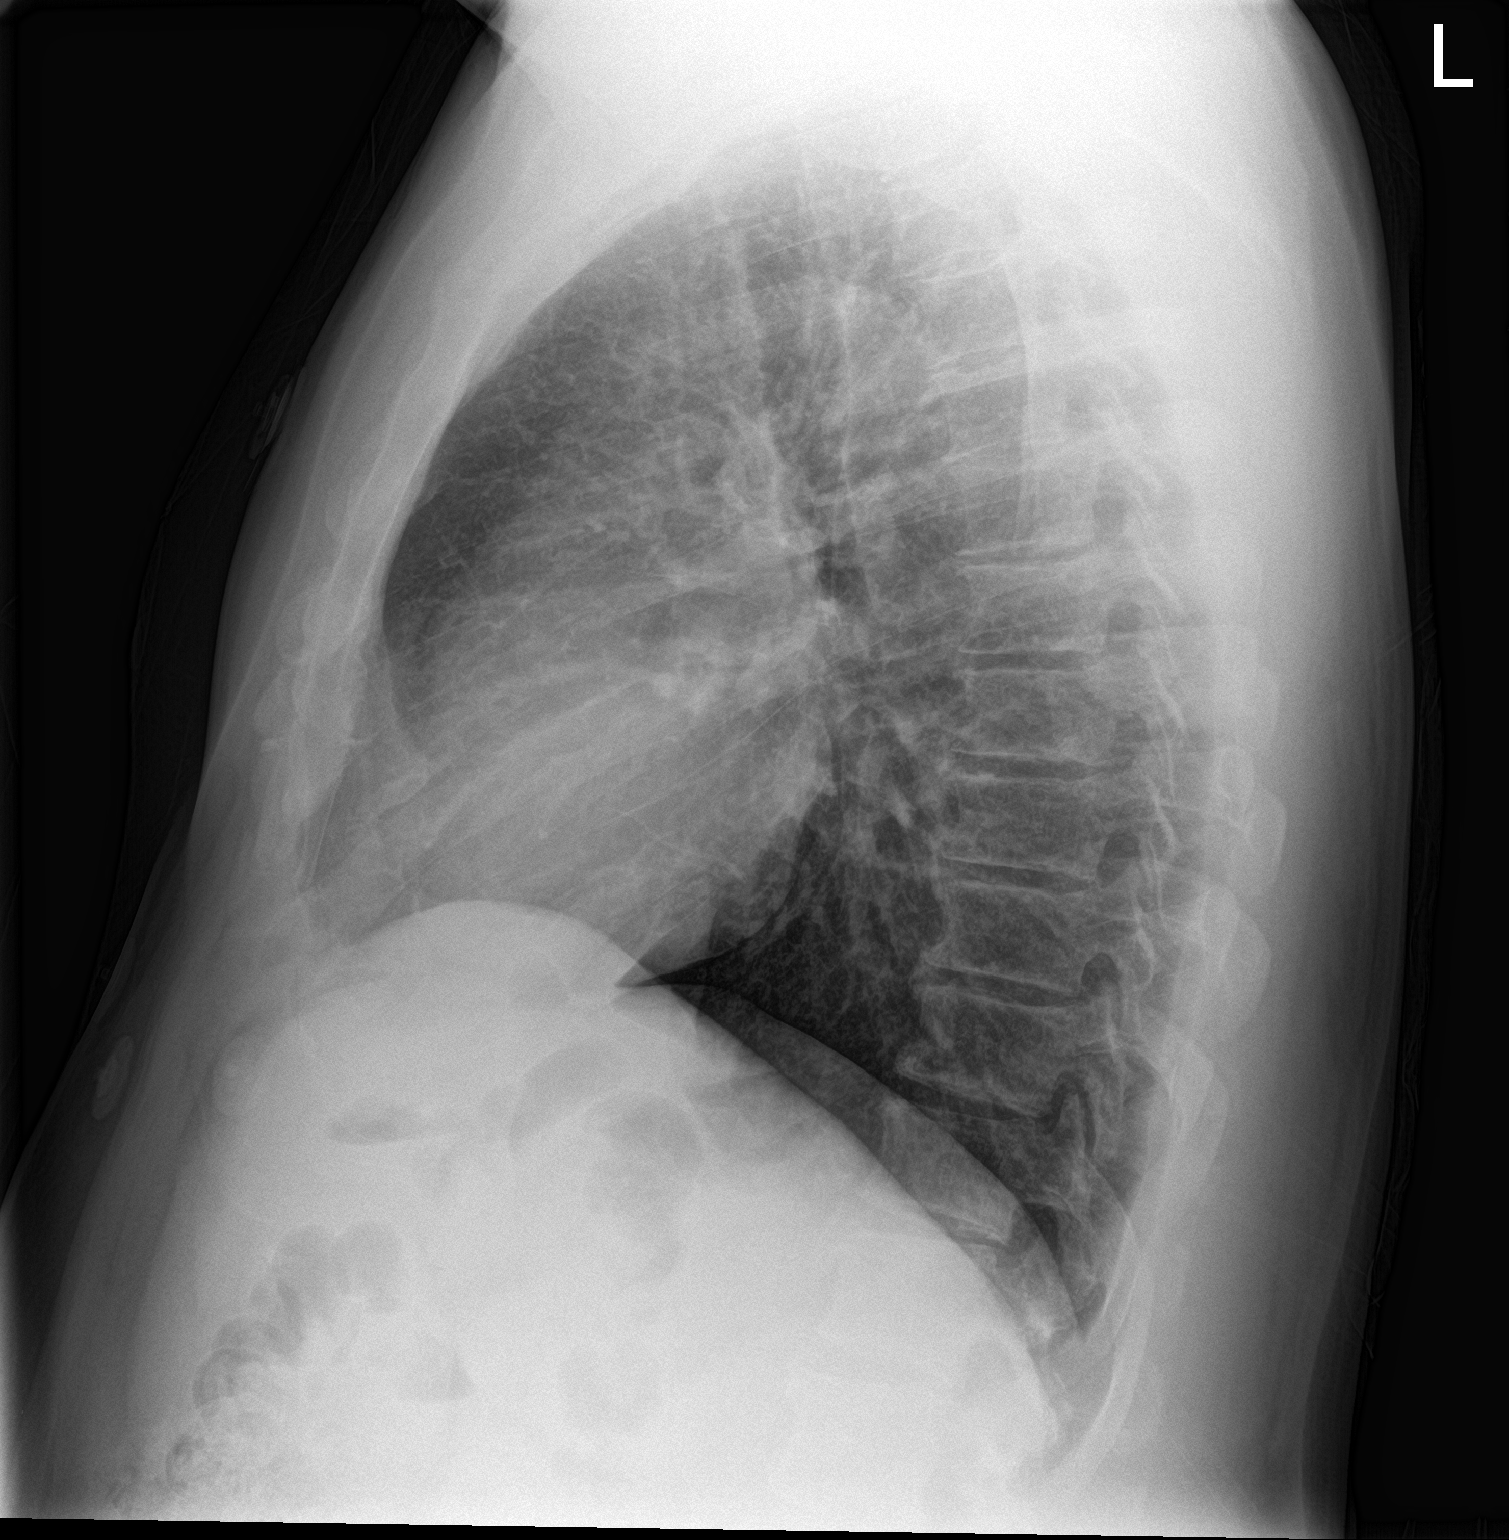

[2 of 2 positions shown; findings below may reference images not displayed]

FINDINGS: The cardiac silhouette, mediastinal and hilar contours are within
normal limits. The lungs are clear. No pleural effusions or
pneumothorax.

Rounded density in the left lung base could represent the nipple
shadow artifact from EKG leads. A repeat PA chest film with nipple
markers and the EKG leads removed may be helpful. The bony thorax is
intact.
IMPRESSION: No acute cardiopulmonary findings.

Left lower lobe nodular density as above.

## 2023-06-04 ENCOUNTER — Emergency Department (HOSPITAL_COMMUNITY): Payer: BLUE CROSS/BLUE SHIELD

## 2023-06-04 ENCOUNTER — Observation Stay (HOSPITAL_COMMUNITY)
Admission: EM | Admit: 2023-06-04 | Discharge: 2023-06-05 | Disposition: A | Discharge status: Home / Self Care | Payer: BLUE CROSS/BLUE SHIELD | Attending: Internal Medicine | Admitting: Internal Medicine

## 2023-06-04 ENCOUNTER — Encounter (HOSPITAL_COMMUNITY): Payer: Self-pay

## 2023-06-04 ENCOUNTER — Other Ambulatory Visit: Payer: Self-pay

## 2023-06-04 DIAGNOSIS — R7989 Other specified abnormal findings of blood chemistry: Secondary | ICD-10-CM | POA: Diagnosis not present

## 2023-06-04 DIAGNOSIS — F17210 Nicotine dependence, cigarettes, uncomplicated: Secondary | ICD-10-CM | POA: Insufficient documentation

## 2023-06-04 DIAGNOSIS — Z955 Presence of coronary angioplasty implant and graft: Secondary | ICD-10-CM | POA: Diagnosis not present

## 2023-06-04 DIAGNOSIS — I161 Hypertensive emergency: Principal | ICD-10-CM

## 2023-06-04 DIAGNOSIS — Z79899 Other long term (current) drug therapy: Secondary | ICD-10-CM | POA: Diagnosis not present

## 2023-06-04 DIAGNOSIS — I214 Non-ST elevation (NSTEMI) myocardial infarction: Secondary | ICD-10-CM | POA: Diagnosis not present

## 2023-06-04 DIAGNOSIS — I2489 Other forms of acute ischemic heart disease: Secondary | ICD-10-CM | POA: Diagnosis not present

## 2023-06-04 DIAGNOSIS — R079 Chest pain, unspecified: Secondary | ICD-10-CM | POA: Diagnosis present

## 2023-06-04 DIAGNOSIS — I2510 Atherosclerotic heart disease of native coronary artery without angina pectoris: Secondary | ICD-10-CM | POA: Insufficient documentation

## 2023-06-04 DIAGNOSIS — I160 Hypertensive urgency: Secondary | ICD-10-CM | POA: Diagnosis not present

## 2023-06-04 DIAGNOSIS — Z720 Tobacco use: Secondary | ICD-10-CM | POA: Diagnosis not present

## 2023-06-04 LAB — BASIC METABOLIC PANEL WITH GFR
Anion gap: 15 (ref 5–15)
BUN: 15 mg/dL (ref 6–20)
CO2: 18 mmol/L — ABNORMAL LOW (ref 22–32)
Calcium: 9.2 mg/dL (ref 8.9–10.3)
Chloride: 103 mmol/L (ref 98–111)
Creatinine, Ser: 1.2 mg/dL (ref 0.61–1.24)
GFR, Estimated: >60 mL/min
Glucose, Bld: 112 mg/dL — ABNORMAL HIGH (ref 70–99)
Potassium: 3.9 mmol/L (ref 3.5–5.1)
Sodium: 136 mmol/L (ref 135–145)

## 2023-06-04 LAB — TROPONIN I (HIGH SENSITIVITY)
Troponin I (High Sensitivity): 60 ng/L — ABNORMAL HIGH (ref <18)
Troponin I (High Sensitivity): 39 ng/L — ABNORMAL HIGH

## 2023-06-04 LAB — LIPID PANEL
Cholesterol: 191 mg/dL (ref 0–200)
HDL: 45 mg/dL (ref >40)
LDL Cholesterol: 125 mg/dL — ABNORMAL HIGH (ref 0–99)
Total CHOL/HDL Ratio: 4.2 RATIO
Triglycerides: 103 mg/dL (ref <150)
VLDL: 21 mg/dL (ref 0–40)

## 2023-06-04 LAB — HEMOGLOBIN A1C
Hgb A1c MFr Bld: 5 % (ref 4.8–5.6)
Mean Plasma Glucose: 96.8 mg/dL

## 2023-06-04 LAB — CBC
HCT: 43.9 % (ref 39.0–52.0)
Hemoglobin: 14.8 g/dL (ref 13.0–17.0)
MCH: 33.3 pg (ref 26.0–34.0)
MCHC: 33.7 g/dL (ref 30.0–36.0)
MCV: 98.9 fL (ref 80.0–100.0)
Platelets: 300 10*3/uL (ref 150–400)
RBC: 4.44 MIL/uL (ref 4.22–5.81)
RDW: 13.5 % (ref 11.5–15.5)
WBC: 11.1 10*3/uL — ABNORMAL HIGH (ref 4.0–10.5)
nRBC: 0 % (ref 0.0–0.2)

## 2023-06-04 LAB — TSH: TSH: 1.9 u[IU]/mL (ref 0.350–4.500)

## 2023-06-04 LAB — HIV ANTIBODY (ROUTINE TESTING W REFLEX): HIV Screen 4th Generation wRfx: NONREACTIVE

## 2023-06-04 LAB — HEPARIN LEVEL (UNFRACTIONATED): Heparin Unfractionated: 0.28 [IU]/mL — ABNORMAL LOW (ref 0.30–0.70)

## 2023-06-04 MED ORDER — METOPROLOL TARTRATE 5 MG/5ML IV SOLN: 5.0000 mg | INTRAVENOUS | Status: DC | PRN

## 2023-06-04 MED ORDER — NITROGLYCERIN 0.4 MG SL SUBL: 0.4000 mg | SUBLINGUAL_TABLET | SUBLINGUAL | Status: DC | PRN

## 2023-06-04 MED ORDER — HYDRALAZINE HCL 20 MG/ML IJ SOLN: 10.0000 mg | INTRAMUSCULAR | Status: DC | PRN

## 2023-06-04 MED ORDER — CARVEDILOL 3.125 MG PO TABS
3.1250 mg | ORAL_TABLET | Freq: Two times a day (BID) | ORAL | Status: DC
  Administered 2023-06-05: 3.125 mg via ORAL
  Filled 2023-06-04: qty 1

## 2023-06-04 MED ORDER — ONDANSETRON HCL 4 MG/2ML IJ SOLN: 4.0000 mg | Freq: Four times a day (QID) | INTRAMUSCULAR | Status: DC | PRN

## 2023-06-04 MED ORDER — ACETAMINOPHEN 325 MG PO TABS: 650.0000 mg | ORAL_TABLET | Freq: Four times a day (QID) | ORAL | Status: DC | PRN

## 2023-06-04 MED ORDER — ATORVASTATIN CALCIUM 40 MG PO TABS
40.0000 mg | ORAL_TABLET | Freq: Every day | ORAL | Status: DC
  Administered 2023-06-04 – 2023-06-05 (×2): 40 mg via ORAL
  Filled 2023-06-04 (×2): qty 1

## 2023-06-04 MED ORDER — OXYCODONE HCL 5 MG PO TABS: 5.0000 mg | ORAL_TABLET | ORAL | Status: DC | PRN

## 2023-06-04 MED ORDER — SENNOSIDES-DOCUSATE SODIUM 8.6-50 MG PO TABS: 1.0000 | ORAL_TABLET | Freq: Every evening | ORAL | Status: DC | PRN

## 2023-06-04 MED ORDER — IPRATROPIUM-ALBUTEROL 0.5-2.5 (3) MG/3ML IN SOLN: 3.0000 mL | RESPIRATORY_TRACT | Status: DC | PRN

## 2023-06-04 MED ORDER — CLONIDINE HCL 0.2 MG PO TABS
0.2000 mg | ORAL_TABLET | Freq: Once | ORAL | Status: AC
  Administered 2023-06-04: 0.2 mg via ORAL
  Filled 2023-06-04: qty 1

## 2023-06-04 MED ORDER — SODIUM CHLORIDE 0.9 % IV SOLN: INTRAVENOUS | Status: DC

## 2023-06-04 MED ORDER — ASPIRIN 81 MG PO TBEC
81.0000 mg | DELAYED_RELEASE_TABLET | Freq: Every day | ORAL | Status: DC
  Administered 2023-06-04 – 2023-06-05 (×2): 81 mg via ORAL
  Filled 2023-06-04 (×2): qty 1

## 2023-06-04 MED ORDER — TRAZODONE HCL 50 MG PO TABS: 50.0000 mg | ORAL_TABLET | Freq: Every evening | ORAL | Status: DC | PRN

## 2023-06-04 MED ORDER — HEPARIN (PORCINE) 25000 UT/250ML-% IV SOLN
1200.0000 [IU]/h | INTRAVENOUS | Status: DC
  Administered 2023-06-04: 1200 [IU]/h via INTRAVENOUS
  Filled 2023-06-04: qty 250

## 2023-06-04 MED ORDER — HEPARIN BOLUS VIA INFUSION
4000.0000 [IU] | Freq: Once | INTRAVENOUS | Status: AC
  Administered 2023-06-04: 4000 [IU] via INTRAVENOUS
  Filled 2023-06-04: qty 4000

## 2023-06-04 MED ORDER — LOSARTAN POTASSIUM 25 MG PO TABS
25.0000 mg | ORAL_TABLET | Freq: Every day | ORAL | Status: DC
  Administered 2023-06-04 – 2023-06-05 (×2): 25 mg via ORAL
  Filled 2023-06-04 (×2): qty 1

## 2023-06-04 NOTE — ED Notes (Signed)
ED TO INPATIENT HANDOFF REPORT  ED Nurse Name and Phone #: chris [phone]   S Name/Age/Gender Brett Odom 52 y.o. male Room/Bed: 019C/019C  Code Status   Code Status: Not on file  Home/SNF/Other Home Patient oriented to: self, place, time, and situation Is this baseline? Yes   Triage Complete: Triage complete  Chief Complaint Hypertensive urgency [I16.0]  Triage Note Pt arrives via POV. Pt reports chest pain that started this morning. States it radiates to left arm and left jaw. Pt took 1 nitroglycerin with relief of pain. Pt AxOx4. He is hypertensive during triage.   Allergies Allergies  Allergen Reactions   Codeine Nausea And Vomiting    Level of Care/Admitting Diagnosis ED Disposition     ED Disposition  Admit   Condition  --   Comment  Hospital Area: [facility] [[id number]]  Level of Care: Telemetry Cardiac [103]  [facility] place patient in observation at [facility] or [facility] if equivalent level of care is available:: Yes  Covid Evaluation: Confirmed COVID Negative  Diagnosis: Hypertensive urgency [161096]  Admitting Physician: Stephania Fragmin [facility] [[id number]]  Attending Physician: Stephania Fragmin [facility] [[id number]]          B Medical/Surgery History Past Medical History:  Diagnosis Date   Hypertension    Myocardial infarction [facility])    Past Surgical History:  Procedure Laterality Date   HERNIA REPAIR     KNEE SURGERY       A IV Location/Drains/Wounds Patient Lines/Drains/Airways Status     Active Line/Drains/Airways     Name Placement date Placement time Site Days   Peripheral IV 06/04/23 18 G Right Antecubital 06/04/23  1450  Antecubital  less than 1            Intake/Output Last 24 hours No intake or output data in the 24 hours ending 06/04/23 1605  Labs/Imaging Results for orders placed or performed during the hospital encounter of 06/04/23 (from the past 48 hour(s))  Troponin I (High Sensitivity)     Status: Abnormal    Collection Time: 06/04/23 11:48 AM  Result Value Ref Range   Troponin I (High Sensitivity) 60 (H) <18 ng/L    Comment: DELTA CHECK NOTED RESULT CALLED TO, READ BACK BY AND VERIFIED WITH: C CHRISCOE,RN 1540 06/04/2023 WBOND Performed at [facility], [address], [place], [place] [address]   Basic metabolic panel     Status: Abnormal   Collection Time: 06/04/23 12:59 PM  Result Value Ref Range   Sodium 136 135 - 145 mmol/L   Potassium 3.9 3.5 - 5.1 mmol/L   Chloride 103 98 - 111 mmol/L   CO2 18 (L) 22 - 32 mmol/L   Glucose, Bld 112 (H) 70 - 99 mg/dL    Comment: Glucose reference range applies only to samples taken after fasting for at least 8 hours.   BUN 15 6 - 20 mg/dL   Creatinine, Ser 1.30 0.61 - 1.24 mg/dL   Calcium 9.2 8.9 - 86.5 mg/dL   GFR, Estimated >78 >46 mL/min    Comment: (NOTE) Calculated using the CKD-EPI Creatinine Equation (2021)    Anion gap 15 5 - 15    Comment: Performed at [facility], [address], [place], [place] [address]  CBC     Status: Abnormal   Collection Time: 06/04/23 12:59 PM  Result Value Ref Range   WBC 11.1 (H) 4.0 - 10.5 K/uL   RBC 4.44 4.22 - 5.81 MIL/uL  Hemoglobin 14.8 13.0 - 17.0 g/dL   HCT 57.8 46.9 - 62.9 %   MCV 98.9 80.0 - 100.0 fL   MCH 33.3 26.0 - 34.0 pg   MCHC 33.7 30.0 - 36.0 g/dL   RDW 52.8 41.3 - 24.4 %   Platelets 300 150 - 400 K/uL   nRBC 0.0 0.0 - 0.2 %    Comment: Performed at [facility], [address], [place], [place] [address]  Troponin I (High Sensitivity)     Status: Abnormal   Collection Time: 06/04/23 12:59 PM  Result Value Ref Range   Troponin I (High Sensitivity) 39 (H) <18 ng/L    Comment: (NOTE) Elevated high sensitivity troponin I (hsTnI) values and significant  changes across serial measurements may suggest ACS but many other  chronic and acute conditions are known to elevate hsTnI results.  Refer to the "Links" section for chest pain algorithms and additional   guidance. Performed at [facility], [address], [place], [place] [address]    DG Chest 2 View  Result Date: 06/04/2023 CLINICAL DATA:  52 year old male with chest pain EXAM: CHEST - 2 VIEW COMPARISON:  03/18/2014, 01/07/2021 FINDINGS: Cardiomediastinal silhouette unchanged in size and contour. No evidence of central vascular congestion. No interlobular septal thickening. No pneumothorax or pleural effusion. Coarsened interstitial markings, with no confluent airspace disease. No acute displaced fracture. Degenerative changes of the spine. IMPRESSION: No active cardiopulmonary disease. Electronically Signed   By: Gilmer Mor D.O.   On: 06/04/2023 14:21    Pending Labs Unresulted Labs (From admission, onward)     Start     Ordered   06/05/23 0500  Heparin level (unfractionated)  Daily,   R     Placed in "And" Linked Group   06/04/23 1510   06/05/23 0500  CBC  Daily,   R     Placed in "And" Linked Group   06/04/23 1510   06/04/23 2100  Heparin level (unfractionated)  Once-Timed,   URGENT        06/04/23 1510            Vitals/Pain Today's Vitals   06/04/23 1242 06/04/23 1252 06/04/23 1445 06/04/23 1559  BP: (!) 217/124  (!) 215/111   Pulse: 77  79   Resp: 18  16   Temp: 97.7 F (36.5 C)   97.8 F (36.6 C)  SpO2: 100%  100%   Weight:  97.5 kg    Height:  5\' 9"  (1.753 m)    PainSc:  0-No pain      Isolation Precautions No active isolations  Medications Medications  heparin ADULT infusion 100 units/mL (25000 units/280mL) (1,200 Units/hr Intravenous New Bag/Given 06/04/23 1556)  cloNIDine (CATAPRES) tablet 0.2 mg (0.2 mg Oral Given 06/04/23 1451)  heparin bolus via infusion 4,000 Units (4,000 Units Intravenous Bolus from Bag 06/04/23 1557)    Mobility walks     Focused Assessments Cardiac Assessment Handoff:    Lab Results  Component Value Date   TROPONINI <0.30 03/18/2014   No results found for: "DDIMER" Does the Patient currently have chest pain? No     R Recommendations: See Admitting Provider Note  Report given to:   Additional Notes:

## 2023-06-04 NOTE — Plan of Care (Signed)

## 2023-06-04 NOTE — ED Notes (Signed)
Waiting for  witness for the hep drip to be started

## 2023-06-04 NOTE — Progress Notes (Signed)
ANTICOAGULATION CONSULT NOTE - Initial Consult  Pharmacy Consult for heparin Indication: chest pain/ACS  Allergies  Allergen Reactions   Codeine Nausea And Vomiting    Patient Measurements: Height: 5\' 9"  (175.3 cm) Weight: 97.5 kg (215 lb) IBW/kg (Calculated) : 70.7 Heparin Dosing Weight: 91kg  Vital Signs: Temp: 97.7 F (36.5 C) (08/07 1242) BP: 215/111 (08/07 1445) Pulse Rate: 79 (08/07 1445)  Labs: Recent Labs    06/04/23 1259  HGB 14.8  HCT 43.9  PLT 300  CREATININE 1.20  TROPONINIHS 39*    Estimated Creatinine Clearance: 84.8 mL/min (by C-G formula based on SCr of 1.2 mg/dL).   Medical History: Past Medical History:  Diagnosis Date   Hypertension    Myocardial infarction [facility])     Assessment: 81 YOM presenting with CP, hx CAD, he is not on anticoagulation PTA, CBC wnl  Goal of Therapy:  Heparin level 0.3-0.7 units/ml Monitor platelets by anticoagulation protocol: Yes   Plan:  Heparin 4000 units IV x 1, and gtt at 1200 units/hr F/u 6 hour heparin level F/u cards eval and recs  Daylene Posey, PharmD, [facility] Clinical Pharmacist ED Pharmacist Phone # [phone] 06/04/2023 3:09 PM

## 2023-06-04 NOTE — ED Notes (Signed)
I have called the floor  the pt can come up now

## 2023-06-04 NOTE — ED Notes (Signed)
Admitting doctor at the bedside 

## 2023-06-04 NOTE — H&P (Signed)
History and Physical    Brett Odom UEA:[id number] DOB: [birth year] DOA: 06/04/2023  PCP: Patient, No Pcp Per Patient coming from: Home    Chief Complaint: Chest pain  HPI: Brett Odom is a 52 y.o. male with medical history significant of CAD treated with balloon angioplasty several years ago per patient at [facility], tobacco use, only takes 2 baby aspirin daily comes to the hospital with complaints of chest pain.  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  Soon after he took nitro from one of his coworker which helped subside his pain.  Due to concerning nature of this pain he decided to come to the hospital.  Tells me he had similar pain several years ago and went to [facility] where he had left heart catheterization which was treated with balloon angioplasty and thereafter not on any medication.  Last cardiology note he should be on antiplatelet therapy?  Note is quite unclear regarding exact intervention.  Today in the ED his systolic blood pressure was greater than 200 with elevated troponin of 60.  EKG showed some LVH changes.  Started on heparin drip.  [facility] cardiology was consulted.  Patient smokes about 1.5 pack of cigarettes daily.   Review of Systems: As per HPI otherwise 10 point review of systems negative.  Review of Systems Otherwise negative except as per HPI, including: General: Denies fever, chills, night sweats or unintended weight loss. Resp: Denies cough, wheezing, shortness of breath. Cardiac: Denies palpitations, orthopnea, paroxysmal nocturnal dyspnea. GI: Denies abdominal pain, nausea, vomiting, diarrhea or constipation GU: Denies dysuria, frequency, hesitancy or incontinence MS: Denies muscle aches, joint pain or swelling Neuro: Denies headache, neurologic deficits (focal weakness, numbness, tingling), abnormal gait Psych: Denies anxiety, depression,  SI/HI/AVH Skin: Denies new rashes or lesions ID: Denies sick contacts, exotic exposures, travel  Past Medical History:  Diagnosis Date   Hypertension    Myocardial infarction [facility])     Past Surgical History:  Procedure Laterality Date   HERNIA REPAIR     KNEE SURGERY      SOCIAL HISTORY:  reports that he has been smoking. He has never used smokeless tobacco. He reports current alcohol use. He reports that he does not currently use drugs.  Allergies  Allergen Reactions   Codeine Nausea And Vomiting    FAMILY HISTORY: History reviewed. No pertinent family history.   Prior to Admission medications   Medication Sig Start Date End Date Taking? Authorizing Provider  amoxicillin-clavulanate (AUGMENTIN) 875-125 MG per tablet Take 1 tablet by mouth 2 (two) times daily. Started 03/07/14    [provider]  omeprazole (PRILOSEC) 20 MG capsule Take 1 capsule (20 mg total) by mouth daily. 03/18/14   Raymon Mutton, PA-C    Physical Exam: Vitals:   06/04/23 1242 06/04/23 1252 06/04/23 1445 06/04/23 1559  BP: (!) 217/124  (!) 215/111   Pulse: 77  79   Resp: 18  16   Temp: 97.7 F (36.5 C)   97.8 F (36.6 C)  SpO2: 100%  100%   Weight:  97.5 kg    Height:  5\' 9"  (1.753 m)        Constitutional: NAD, calm, comfortable Eyes: PERRL, lids and conjunctivae normal ENMT: Mucous membranes are moist. Posterior pharynx clear of any exudate or lesions.Normal dentition.  Neck: normal, supple, no masses, no thyromegaly Respiratory: clear to auscultation bilaterally, no wheezing,  no crackles. Normal respiratory effort. No accessory muscle use.  Cardiovascular: Regular rate and rhythm, no murmurs / rubs / gallops. No extremity edema. 2+ pedal pulses. No carotid bruits.  Abdomen: no tenderness, no masses palpated. No hepatosplenomegaly. Bowel sounds positive.  Musculoskeletal: no clubbing / cyanosis. No joint deformity upper and lower extremities. Good ROM, no contractures. Normal  muscle tone.  Skin: no rashes, lesions, ulcers. No induration Neurologic: CN 2-12 grossly intact. Sensation intact, DTR normal. Strength 5/5 in all 4.  Psychiatric: Normal judgment and insight. Alert and oriented x 3. Normal mood.     Labs on Admission: I have personally reviewed following labs and imaging studies  CBC: Recent Labs  Lab 06/04/23 1259  WBC 11.1*  HGB 14.8  HCT 43.9  MCV 98.9  PLT 300   Basic Metabolic Panel: Recent Labs  Lab 06/04/23 1259  NA 136  K 3.9  CL 103  CO2 18*  GLUCOSE 112*  BUN 15  CREATININE 1.20  CALCIUM 9.2   GFR: Estimated Creatinine Clearance: 84.8 mL/min (by C-G formula based on SCr of 1.2 mg/dL). Liver Function Tests: No results for input(s): "AST", "ALT", "ALKPHOS", "BILITOT", "PROT", "ALBUMIN" in the last 168 hours. No results for input(s): "LIPASE", "AMYLASE" in the last 168 hours. No results for input(s): "AMMONIA" in the last 168 hours. Coagulation Profile: No results for input(s): "INR", "PROTIME" in the last 168 hours. Cardiac Enzymes: No results for input(s): "CKTOTAL", "CKMB", "CKMBINDEX", "TROPONINI" in the last 168 hours. BNP (last 3 results) No results for input(s): "PROBNP" in the last 8760 hours. HbA1C: No results for input(s): "HGBA1C" in the last 72 hours. CBG: No results for input(s): "GLUCAP" in the last 168 hours. Lipid Profile: No results for input(s): "CHOL", "HDL", "LDLCALC", "TRIG", "CHOLHDL", "LDLDIRECT" in the last 72 hours. Thyroid Function Tests: No results for input(s): "TSH", "T4TOTAL", "FREET4", "T3FREE", "THYROIDAB" in the last 72 hours. Anemia Panel: No results for input(s): "VITAMINB12", "FOLATE", "FERRITIN", "TIBC", "IRON", "RETICCTPCT" in the last 72 hours. Urine analysis:    Component Value Date/Time   COLORURINE YELLOW 03/18/2014 1652   APPEARANCEUR CLEAR 03/18/2014 1652   LABSPEC 1.011 03/18/2014 1652   PHURINE 6.0 03/18/2014 1652   GLUCOSEU NEGATIVE 03/18/2014 1652   HGBUR NEGATIVE  03/18/2014 1652   BILIRUBINUR NEGATIVE 03/18/2014 1652   KETONESUR NEGATIVE 03/18/2014 1652   PROTEINUR NEGATIVE 03/18/2014 1652   UROBILINOGEN 0.2 03/18/2014 1652   NITRITE NEGATIVE 03/18/2014 1652   LEUKOCYTESUR NEGATIVE 03/18/2014 1652   Sepsis Labs: !!!!!!!!!!!!!!!!!!!!!!!!!!!!!!!!!!!!!!!!!!!! @LABRCNTIP (procalcitonin:4,lacticidven:4) )No results found for this or any previous visit (from the past 240 hour(s)).   Radiological Exams on Admission: DG Chest 2 View  Result Date: 06/04/2023 CLINICAL DATA:  52 year old male with chest pain EXAM: CHEST - 2 VIEW COMPARISON:  03/18/2014, 01/07/2021 FINDINGS: Cardiomediastinal silhouette unchanged in size and contour. No evidence of central vascular congestion. No interlobular septal thickening. No pneumothorax or pleural effusion. Coarsened interstitial markings, with no confluent airspace disease. No acute displaced fracture. Degenerative changes of the spine. IMPRESSION: No active cardiopulmonary disease. Electronically Signed   By: Gilmer Mor D.O.   On: 06/04/2023 14:21     All images have been reviewed by me personally.  EKG: Independently reviewed.  No acute ST-T changes  Assessment/Plan Principal Problem:   Hypertensive urgency Active Problems:   NSTEMI (non-ST elevated myocardial infarction) (HCC)   Tobacco use   Hypertensive urgency with chest pain NSTEMI History of CAD -Admit patient to the hospital.  Will place him on telemetry, trend  cardiac enzymes, first set 66.  Started on heparin drip in ED.  Will check A1c, lipid panel.  Nitro as needed.  Check TSH, UDS.  [facility] cards consulted to see what will be the most appropriate next step for him in terms of echocardiogram/stress test versus LHC versus medical management.  Tobacco use - Counseled to quit using tobacco.  As needed bronchodilators.   DVT prophylaxis: Hep Drip Code Status: Full Family Communication:  Consults called: Cardiology Admission status: Tele  Status  is: Observation The patient remains OBS appropriate and will d/c before 2 midnights.   Time Spent: 65 minutes.  >50% of the time was devoted to discussing the patients care, assessment, plan and disposition with other care givers along with counseling the patient about the risks and benefits of treatment.    Javarian Jakubiak Joline Maxcy MD [facility]  If 7PM-7AM, please contact night-coverage   06/04/2023, 4:18 PM

## 2023-06-04 NOTE — ED Triage Notes (Addendum)
Pt arrives via POV. Pt reports chest pain that started this morning. States it radiates to left arm and left jaw. Pt took 1 nitroglycerin with relief of pain. Pt AxOx4. He is hypertensive during triage.

## 2023-06-04 NOTE — ED Provider Notes (Signed)
[place] EMERGENCY DEPARTMENT AT [facility] Provider Note   CSN: [id number] Arrival date & time: 06/04/23  1236     History  Chief Complaint  Patient presents with   Chest Pain    Brett Odom is a 52 y.o. male.  The history is provided by the patient and medical records. No language interpreter was used.  Chest Pain    52 year old man with significant history of hypertension, prior MI presented to ED with complaints of chest pain.  Patient works for a [employer].  This morning while at work he developed pain in his chest.  He described as a heavy pressure sensation in his left upper chest radiates towards his left arm as well as his jaw and lasting for approximately an hour.  One of his coworkers has some nitroglycerin that he did took and did report improvement of his symptoms.  States that pain has since resolved.  He states symptoms felt similar to prior "heart attack" that he has had 7 years ago.  He was noted to have elevated blood pressure and states that he is not on any blood pressure medication because he has not follow-up with any provider.  He denies any fever or chills no nausea vomiting or diarrhea no lightheadedness no dizziness no shortness of breath.  No abdominal pain or back pain.  Admits to heavy tobacco use smoking approximately 1 to 1-1/2 pack daily.  He is a social alcohol drinker.    Home Medications Prior to Admission medications   Medication Sig Start Date End Date Taking? Authorizing Provider  amoxicillin-clavulanate (AUGMENTIN) 875-125 MG per tablet Take 1 tablet by mouth 2 (two) times daily. Started 03/07/14    [provider]  omeprazole (PRILOSEC) 20 MG capsule Take 1 capsule (20 mg total) by mouth daily. 03/18/14   Sciacca, Marissa, PA-C      Allergies    Codeine    Review of Systems   Review of Systems  Cardiovascular:  Positive for chest pain.  All other systems reviewed and are negative.   Physical Exam Updated  Vital Signs BP (!) 215/111   Pulse 79   Temp 97.7 F (36.5 C)   Resp 16   Ht 5\' 9"  (1.753 m)   Wt 97.5 kg   SpO2 100%   BMI 31.75 kg/m  Physical Exam Vitals and nursing note reviewed.  Constitutional:      General: He is not in acute distress.    Appearance: He is well-developed.  HENT:     Head: Atraumatic.  Eyes:     Conjunctiva/sclera: Conjunctivae normal.  Cardiovascular:     Rate and Rhythm: Normal rate and regular rhythm.     Pulses: Normal pulses.     Heart sounds: Normal heart sounds.  Pulmonary:     Effort: Pulmonary effort is normal.     Breath sounds: Normal breath sounds. No wheezing, rhonchi or rales.  Abdominal:     General: There is no distension.     Palpations: Abdomen is soft.  Musculoskeletal:     Cervical back: Neck supple.     Right lower leg: No edema.     Left lower leg: No edema.  Skin:    Findings: No rash.  Neurological:     Mental Status: He is alert.     ED Results / Procedures / Treatments   Labs (all labs ordered are listed, but only abnormal results are displayed) Labs Reviewed  BASIC METABOLIC PANEL -  Abnormal; Notable for the following components:      Result Value   CO2 18 (*)    Glucose, Bld 112 (*)    All other components within normal limits  CBC - Abnormal; Notable for the following components:   WBC 11.1 (*)    All other components within normal limits  TROPONIN I (HIGH SENSITIVITY) - Abnormal; Notable for the following components:   Troponin I (High Sensitivity) 39 (*)    All other components within normal limits  HEPARIN LEVEL (UNFRACTIONATED)  TROPONIN I (HIGH SENSITIVITY)    EKG EKG Interpretation Date/Time:  Wednesday June 04 2023 12:45:03 EDT Ventricular Rate:  75 PR Interval:  168 QRS Duration:  90 QT Interval:  370 QTC Calculation: 413 R Axis:   28  Text Interpretation: Normal sinus rhythm Minimal voltage criteria for LVH, may be normal variant ( Sokolow-Lyon ) When compared with ECG of 07-Jan-2021  17:25, PREVIOUS ECG IS PRESENT No significant changes Confirmed by Alvester Chou [phone]) on 06/04/2023 2:30:19 PM  Radiology DG Chest 2 View  Result Date: 06/04/2023 CLINICAL DATA:  52 year old male with chest pain EXAM: CHEST - 2 VIEW COMPARISON:  03/18/2014, 01/07/2021 FINDINGS: Cardiomediastinal silhouette unchanged in size and contour. No evidence of central vascular congestion. No interlobular septal thickening. No pneumothorax or pleural effusion. Coarsened interstitial markings, with no confluent airspace disease. No acute displaced fracture. Degenerative changes of the spine. IMPRESSION: No active cardiopulmonary disease. Electronically Signed   By: Gilmer Mor D.O.   On: 06/04/2023 14:21    Procedures .Critical Care  Performed by: Fayrene Helper, PA-C Authorized by: Fayrene Helper, PA-C   Critical care provider statement:    Critical care time (minutes):  30   Critical care was time spent personally by me on the following activities:  Development of treatment plan with patient or surrogate, discussions with consultants, evaluation of patient's response to treatment, examination of patient, ordering and review of laboratory studies, ordering and review of radiographic studies, ordering and performing treatments and interventions, pulse oximetry, re-evaluation of patient's condition and review of old charts     Medications Ordered in ED Medications  heparin bolus via infusion 4,000 Units (has no administration in time range)  heparin ADULT infusion 100 units/mL (25000 units/221mL) (has no administration in time range)  cloNIDine (CATAPRES) tablet 0.2 mg (0.2 mg Oral Given 06/04/23 1451)    ED Course/ Medical Decision Making/ A&P             HEART Score: 6                    Medical Decision Making Amount and/or Complexity of Data Reviewed Labs: ordered. Radiology: ordered.  Risk Prescription drug management.   BP (!) 215/111   Pulse 79   Temp 97.7 F (36.5 C)   Resp 16    Ht 5\' 9"  (1.753 m)   Wt 97.5 kg   SpO2 100%   BMI 31.75 kg/m   27:33 PM  52 year old man with significant history of hypertension, prior MI presented to ED with complaints of chest pain.  Patient works for a [employer].  This morning while at work he developed pain in his chest.  He described as a heavy pressure sensation in his left upper chest radiates towards his left arm as well as his jaw and lasting for approximately an hour.  One of his coworkers has some nitroglycerin that he did take and did report improvement of his symptoms.  States  that pain has since resolved.  He states symptoms felt similar to prior "heart attack" that he has had 7 years ago.  He was noted to have elevated blood pressure and states that he is not on any blood pressure medication because he has not follow-up with any provider.  He denies any fever or chills no nausea vomiting or diarrhea no lightheadedness no dizziness no shortness of breath.  No abdominal pain or back pain.  Admits to heavy tobacco use smoking approximately 1 to 1-1/2 pack daily.  He is a social alcohol drinker.  On exam well-appearing male resting comfortably in bed appears to be in no acute discomfort.  Heart with normal rate and rhythm, lungs clear to auscultation bilaterally abdomen is soft nontender no peripheral edema noted.  He has intact distal pulses.  Vital signs remarkable for markedly elevated blood pressure of 215/111.  He is afebrile no hypoxia.  Chest pains concerning for potential hypertensive emergency.  Workup initiated. Pt given clonidine  EMR reviewed, patient did have a similar chest pain in 2016 where heart catheterization revealed nonocclusive CAD and vasospasm.  Had a cardiac stress test a year later and that was normal.  -Labs ordered, independently viewed and interpreted by me.  Labs remarkable for Trop 39, will initiate heparin.  He's currently CP free.  EKG showing  -The patient was maintained on a cardiac  monitor.  I personally viewed and interpreted the cardiac monitored which showed an underlying rhythm of: NSR -Imaging independently viewed and interpreted by me and I agree with radiologist's interpretation.  Result remarkable for CXR without acute changes -This patient presents to the ED for concern of chest pain, this involves an extensive number of treatment options, and is a complaint that carries with it a high risk of complications and morbidity.  The differential diagnosis includes acs, prinzmetal angina, gerd, gastritis, aortic dissection, PE, PTX -Co morbidities that complicate the patient evaluation includes prior MI, HTN, tobacco use -Treatment includes heparin and clonidine -Reevaluation of the patient after these medicines showed that the patient resolved -PCP office notes or outside notes reviewed -Discussion with specialist [facility] Dr. Nelson Chimes who agrees to admit pt -Escalation to admission/observation considered: patient is agreeable with admission.          Final Clinical Impression(s) / ED Diagnoses Final diagnoses:  Hypertensive emergency    Rx / DC Orders ED Discharge Orders     None         Fayrene Helper, PA-C 06/04/23 1534    Terald Sleeper, MD 06/05/23 [phone]

## 2023-06-04 NOTE — Consult Note (Addendum)
Cardiology Consultation   Patient ID: RUTHFORD KOZICKI MRN: [id number]; DOB: 1971-02-20  Admit date: 06/04/2023 Date of Consult: 06/04/2023  PCP:  Patient, No Pcp Per   [facility] Providers Cardiologist: Used to see [facility] cardiology and Dr Tomie China    Patient Profile:   AKIR WILMES is a 52 y.o. male with a hx of CAD, hypertension, tobacco use,  who is being seen 06/04/2023 for the evaluation of chest pain at the request of Dr Nelson Chimes.  History of Present Illness:   Mr. Worstell with above past medical history presented to the hospital complaining chest pain. 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 Pain was mild initially, similar to heart burn. Pain progressively got worse, 4/10 at worst, and it was radiating to left arm and jaw. He took a friend's SL Nitroglycerin which helped some relief of his pain. He noted his BP has been elevated lately but it no longer on any antihypertensive. He takes ibuprofen and baby aspirin daily. He states he had heart attack 8 years ago, was treated with balloon angioplasty for 2 small blood vessel, he was told his vessel collapsed, no stent was felt required. He states his cholesterol and thyroid function were good and he shortly stopped taking cardiac medication and was discharged by his cardiologist in 6 month. He does not follow any cardiologist now. 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 He no longer having any chest pain now. He is asking if he has to stay overnight in the hospital. He smokes 1.5 PPD currently.   Lab today showed bicarb 18, otherwise unremarkable BMP. Hs trop 60 >39. CBC diff showed WBC 11100. CXR showed  no acute findings. EKG showed sinus rhythm 75 bpm, LVH. Cardiology is consulted for chest pain. He was hypertensive with  BP up to 217/124 at ED.   Per chart review, patient followed Dr Tomie China in 2017 and [facility] in the past. He was on ASA, brilinta, lipitor, lisinopril and metoprool in 2017 records for CAD. No cath report available.     Past Medical History:  Diagnosis Date   Hypertension    Myocardial infarction [facility])     Past Surgical History:  Procedure Laterality Date   HERNIA REPAIR     KNEE SURGERY       Home Medications:  Prior to Admission medications   Medication Sig Start Date End Date Taking? Authorizing Provider  amoxicillin-clavulanate (AUGMENTIN) 875-125 MG per tablet Take 1 tablet by mouth 2 (two) times daily. Started 03/07/14    [provider]  omeprazole (PRILOSEC) 20 MG capsule Take 1 capsule (20 mg total) by mouth daily. 03/18/14   Raymon Mutton, PA-C    Inpatient Medications: Scheduled Meds:  aspirin EC  81 mg Oral Daily   atorvastatin  40 mg Oral Daily   [START ON 06/05/2023] carvedilol  3.125 mg Oral BID WC   losartan  25 mg Oral Daily   Continuous Infusions:  sodium chloride 75 mL/hr at 06/04/23 1704   PRN Meds: acetaminophen, hydrALAZINE, ipratropium-albuterol, metoprolol tartrate, nitroGLYCERIN, ondansetron (ZOFRAN) IV, oxyCODONE, senna-docusate, traZODone  Allergies:    Allergies  Allergen Reactions   Codeine Nausea And Vomiting    Social History:   Social History   Socioeconomic History   Marital status:  Single    Spouse name: Not on file   Number of children: Not on file   Years of education: Not on file   Highest education level: Not on file  Occupational History   Not on file  Tobacco Use   Smoking status: Every Day   Smokeless tobacco: Never  Substance and Sexual Activity   Alcohol use: Yes   Drug use: Not Currently   Sexual activity: Not on file  Other Topics Concern   Not on file  Social History Narrative   Not on file   Social Determinants of Health   Financial Resource Strain: Not on file  Food Insecurity:  No Food Insecurity (06/04/2023)   Hunger Vital Sign    Worried About Running Out of Food in the Last Year: Never true    Ran Out of Food in the Last Year: Never true  Transportation Needs: No Transportation Needs (06/04/2023)   PRAPARE - Administrator, Civil Service (Medical): No    Lack of Transportation (Non-Medical): No  Physical Activity: Not on file  Stress: Not on file  Social Connections: Unknown (03/08/2022)   [facility]   Social Network    Social Network: Not on file  Intimate Partner Violence: Not At Risk (06/04/2023)   Humiliation, Afraid, Rape, and Kick questionnaire    Fear of Current or Ex-Partner: No    Emotionally Abused: No    Physically Abused: No    Sexually Abused: No    Family History:   Mother: MI, HTN   ROS:  Constitutional: Denied fever, chills, malaise, night sweats Eyes: Denied vision change or loss Ears/Nose/Mouth/Throat: Denied ear ache, sore throat, coughing, sinus pain Cardiovascular: see HPI  Respiratory: see HPI  Gastrointestinal: Denied nausea, vomiting, abdominal pain, diarrhea Genital/Urinary: Denied dysuria, hematuria, urinary frequency/urgency Musculoskeletal: Denied muscle ache, joint pain, weakness Skin: Denied rash, wound Neuro: Denied headache, dizziness, syncope Psych: Denied history of depression/anxiety  Endocrine: Denied history of diabetes    Physical Exam/Data:   Vitals:   06/04/23 1445 06/04/23 1559 06/04/23 1643 06/04/23 1648  BP: (!) 215/111   (!) 159/101  Pulse: 79   60  Resp: 16     Temp:  97.8 F (36.6 C)  98 F (36.7 C)  TempSrc:    Oral  SpO2: 100%   96%  Weight:   96 kg   Height:   5\' 9"  (1.753 m)     Intake/Output Summary (Last 24 hours) at 06/04/2023 1800 Last data filed at 06/04/2023 1726 Gross per 24 hour  Intake 81 ml  Output --  Net 81 ml      06/04/2023    4:43 PM 06/04/2023   12:52 PM 03/18/2014    1:50 PM  Last 3 Weights  Weight (lbs) 211 lb 10.3 oz 215 lb 210 lb  Weight  (kg) 96 kg 97.523 kg 95.255 kg     Body mass index is 31.25 kg/m.   Vitals:  Vitals:   06/04/23 1559 06/04/23 1648  BP:  (!) 159/101  Pulse:  60  Resp:    Temp: 97.8 F (36.6 C) 98 F (36.7 C)  SpO2:  96%   General Appearance: In no apparent distress, laying in bed, well nourished  HEENT: Normocephalic, atraumatic.  Neck: Supple, trachea midline, no JVDs Cardiovascular: Regular rate and rhythm, normal S1-S2,  no murmur Respiratory: Resting breathing unlabored, lungs sounds clear to auscultation bilaterally, no use of accessory muscles. On room air.  No wheezes,  rales or rhonchi.   Gastrointestinal: Bowel sounds positive, abdomen soft, non-tender, non-distended. Extremities: Able to move all extremities in bed without difficulty, no edema of BLE Musculoskeletal: Normal muscle bulk and tone Skin: Intact, warm, dry. No rashes or petechiae noted in exposed areas.  Neurologic: Alert, oriented to person, place and time. Fluent speech, no cognitive deficit, no gross focal neuro deficit Psychiatric: Normal affect. Mood is appropriate.    EKG:  The EKG was personally reviewed and demonstrates:    Sinus rhythm 75 bpm, LVH   Telemetry:  Telemetry was personally reviewed and demonstrates:    Sinus rhythm/bradycardia low 60s   Relevant CV Studies:  N/A available   Laboratory Data:  High Sensitivity Troponin:   Recent Labs  Lab 06/04/23 1148 06/04/23 1259  TROPONINIHS 60* 39*     Chemistry Recent Labs  Lab 06/04/23 1259  NA 136  K 3.9  CL 103  CO2 18*  GLUCOSE 112*  BUN 15  CREATININE 1.20  CALCIUM 9.2  GFRNONAA >60  ANIONGAP 15    No results for input(s): "PROT", "ALBUMIN", "AST", "ALT", "ALKPHOS", "BILITOT" in the last 168 hours. Lipids No results for input(s): "CHOL", "TRIG", "HDL", "LABVLDL", "LDLCALC", "CHOLHDL" in the last 168 hours.  Hematology Recent Labs  Lab 06/04/23 1259  WBC 11.1*  RBC 4.44  HGB 14.8  HCT 43.9  MCV 98.9  MCH 33.3  MCHC 33.7   RDW 13.5  PLT 300   Thyroid No results for input(s): "TSH", "FREET4" in the last 168 hours.  BNPNo results for input(s): "BNP", "PROBNP" in the last 168 hours.  DDimer No results for input(s): "DDIMER" in the last 168 hours.   Radiology/Studies:  DG Chest 2 View  Result Date: 06/04/2023 CLINICAL DATA:  52 year old male with chest pain EXAM: CHEST - 2 VIEW COMPARISON:  03/18/2014, 01/07/2021 FINDINGS: Cardiomediastinal silhouette unchanged in size and contour. No evidence of central vascular congestion. No interlobular septal thickening. No pneumothorax or pleural effusion. Coarsened interstitial markings, with no confluent airspace disease. No acute displaced fracture. Degenerative changes of the spine. IMPRESSION: No active cardiopulmonary disease. Electronically Signed   By: Gilmer Mor D.O.   On: 06/04/2023 14:21     Assessment and Plan:   NSTEMI - hx of CAD requirng balloon angioplasty without stent placement 8 years ago at [facility] (patient reports) - presented with chest pain while driving, no historical exertional angina symptoms and tolerate high level exertion well at baseline  - Hs trop 60 >39 - EKG no acute ischemic changes - suspect type II ischemia in the setting of HTN emergency, although unable to rule out ischemia given hx of CAD requiring angioplasty, active tobacco use, HTN, family hx CAD - agree with checking A1C, Lipid panel, will add Echo  - gradual reduction of BP, start coreg 3.125mg  BID, losartan 25mg  daily  - consider obtain gated CT tomorrow for further ischemic workup once BP is optimally controlled  - start aspirin 81mg  daily, lipitor 40mg  daily; no heparin gtt indicated at this time/will stop   HTN emergency - BP up to 217/124 at ER, has been elevated at home lately, not on any meds PTA - start coreg and losartan as above, trend BP, avoid clonidine  - check Echo   Tobacco use - recommend smoking cessation        Risk Assessment/Risk  Scores:   TIMI Risk Score for Unstable Angina or Non-ST Elevation MI:   The patient's TIMI risk score is 3, which indicates  a 13% risk of all cause mortality, new or recurrent myocardial infarction or need for urgent revascularization in the next 14 days.{       For questions or updates, please contact [facility] Please consult [email or web address] for contact info under    Signed, Cyndi Bender, NP  06/04/2023 6:00 PM  Agree with note by Lethea Killings NP  Mr. 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  Lives in [place].  We are seeing him for acute coronary syndrome.  His risk factors include untreated hypertension, ongoing tobacco abuse.  He apparently had heart catheterization performed in [facility] 8 years ago and had angioplasty but no stent.  He is currently on no medications.  He developed chest pain this morning with radiation to his jaw and arm.  Upon presentation his blood pressure was elevated.  His EKG shows LVH.  His troponins were mildly elevated.  Currently on heparin.  His exam is benign.  I suspect this is related to his hypertensive urgency and not CAD.  Plan to treat his hypertension with carvedilol and losartan.  Will check 2D echo and coronary CTA.  Encouraged respect modification including smoking cessation.  Runell Gess, M.D., FACP, [facility], [facility] [address] [place], [place]  [address]  [phone] 06/04/2023 6:24 PM

## 2023-06-05 ENCOUNTER — Observation Stay (HOSPITAL_BASED_OUTPATIENT_CLINIC_OR_DEPARTMENT_OTHER): Payer: BLUE CROSS/BLUE SHIELD

## 2023-06-05 ENCOUNTER — Other Ambulatory Visit: Payer: Self-pay | Admitting: Cardiology

## 2023-06-05 ENCOUNTER — Observation Stay (HOSPITAL_BASED_OUTPATIENT_CLINIC_OR_DEPARTMENT_OTHER)
Admit: 2023-06-05 | Discharge: 2023-06-05 | Disposition: A | Discharge status: Home / Self Care | Payer: BLUE CROSS/BLUE SHIELD | Attending: Cardiology | Admitting: Cardiology

## 2023-06-05 DIAGNOSIS — Z720 Tobacco use: Secondary | ICD-10-CM | POA: Diagnosis not present

## 2023-06-05 DIAGNOSIS — I2489 Other forms of acute ischemic heart disease: Secondary | ICD-10-CM

## 2023-06-05 DIAGNOSIS — R931 Abnormal findings on diagnostic imaging of heart and coronary circulation: Secondary | ICD-10-CM | POA: Diagnosis not present

## 2023-06-05 DIAGNOSIS — I160 Hypertensive urgency: Secondary | ICD-10-CM | POA: Diagnosis not present

## 2023-06-05 DIAGNOSIS — R079 Chest pain, unspecified: Secondary | ICD-10-CM

## 2023-06-05 DIAGNOSIS — I2510 Atherosclerotic heart disease of native coronary artery without angina pectoris: Secondary | ICD-10-CM

## 2023-06-05 DIAGNOSIS — I214 Non-ST elevation (NSTEMI) myocardial infarction: Secondary | ICD-10-CM | POA: Diagnosis not present

## 2023-06-05 MED ORDER — IOHEXOL 350 MG/ML SOLN
95.0000 mL | Freq: Once | INTRAVENOUS | Status: AC | PRN
  Administered 2023-06-05: 95 mL via INTRAVENOUS

## 2023-06-05 MED ORDER — ASPIRIN 81 MG PO TBEC: 81.0000 mg | DELAYED_RELEASE_TABLET | Freq: Every day | ORAL | 2 refills | Status: AC

## 2023-06-05 MED ORDER — ATORVASTATIN CALCIUM 40 MG PO TABS: 40.0000 mg | ORAL_TABLET | Freq: Every day | ORAL | 2 refills | Status: DC

## 2023-06-05 MED ORDER — NITROGLYCERIN 0.4 MG SL SUBL
SUBLINGUAL_TABLET | SUBLINGUAL | Status: AC
  Filled 2023-06-05: qty 2

## 2023-06-05 MED ORDER — LOSARTAN POTASSIUM 50 MG PO TABS: 50.0000 mg | ORAL_TABLET | Freq: Every day | ORAL | 2 refills | Status: DC

## 2023-06-05 MED ORDER — LOSARTAN POTASSIUM 50 MG PO TABS: 50.0000 mg | ORAL_TABLET | Freq: Every day | ORAL | Status: DC

## 2023-06-05 MED ORDER — CARVEDILOL 3.125 MG PO TABS: 3.1250 mg | ORAL_TABLET | Freq: Two times a day (BID) | ORAL | 2 refills | Status: DC

## 2023-06-05 NOTE — Progress Notes (Signed)
Rounding Note    Patient Name: AUTHUR LAUGHEAD Date of Encounter: 06/05/2023  [facility] Cardiologist: None   Subjective   Denies chest pain this morning.  Inpatient Medications    Scheduled Meds:  aspirin EC  81 mg Oral Daily   atorvastatin  40 mg Oral Daily   carvedilol  3.125 mg Oral BID WC   losartan  25 mg Oral Daily   Continuous Infusions:  sodium chloride 75 mL/hr at 06/04/23 1704   PRN Meds: acetaminophen, hydrALAZINE, ipratropium-albuterol, metoprolol tartrate, nitroGLYCERIN, ondansetron (ZOFRAN) IV, oxyCODONE, senna-docusate, traZODone   Vital Signs    Vitals:   06/04/23 1923 06/04/23 2325 06/05/23 0405 06/05/23 0825  BP:  (!) 155/97 (!) 163/93 (!) 172/96  Pulse:  75 60 (!) 53  Resp: 20 12 18 18   Temp: 98.1 F (36.7 C) 98.1 F (36.7 C) 98.1 F (36.7 C) 98.3 F (36.8 C)  TempSrc: Oral Oral Oral Oral  SpO2: 99% 97% 98% 99%  Weight:      Height:        Intake/Output Summary (Last 24 hours) at 06/05/2023 1038 Last data filed at 06/04/2023 2330 Gross per 24 hour  Intake 81 ml  Output --  Net 81 ml      06/04/2023    4:43 PM 06/04/2023   12:52 PM 03/18/2014    1:50 PM  Last 3 Weights  Weight (lbs) 211 lb 10.3 oz 215 lb 210 lb  Weight (kg) 96 kg 97.523 kg 95.255 kg      Telemetry    Sinus rhythm- Personally Reviewed  ECG    Not performed today- Personally Reviewed  Physical Exam   GEN: No acute distress.   Neck: No JVD Cardiac: RRR, no murmurs, rubs, or gallops.  Respiratory: Clear to auscultation bilaterally. GI: Soft, nontender, non-distended  MS: No edema; No deformity. Neuro:  Nonfocal  Psych: Normal affect   Labs    High Sensitivity Troponin:   Recent Labs  Lab 06/04/23 1148 06/04/23 1259  TROPONINIHS 60* 39*     Chemistry Recent Labs  Lab 06/04/23 1259 06/04/23 2342  NA 136 138  K 3.9 3.6  CL 103 108  CO2 18* 24  GLUCOSE 112* 104*  BUN 15 17  CREATININE 1.20 0.95  CALCIUM 9.2 9.0  MG  --  2.0  GFRNONAA  >60 >60  ANIONGAP 15 6    Lipids  Recent Labs  Lab 06/04/23 1811  CHOL 191  TRIG 103  HDL 45  LDLCALC 125*  CHOLHDL 4.2    Hematology Recent Labs  Lab 06/04/23 1259 06/04/23 2342  WBC 11.1* 10.0  RBC 4.44 4.00*  HGB 14.8 13.0  HCT 43.9 38.5*  MCV 98.9 96.3  MCH 33.3 32.5  MCHC 33.7 33.8  RDW 13.5 13.6  PLT 300 262   Thyroid  Recent Labs  Lab 06/04/23 1812  TSH 1.900    BNPNo results for input(s): "BNP", "PROBNP" in the last 168 hours.  DDimer No results for input(s): "DDIMER" in the last 168 hours.   Radiology    ECHOCARDIOGRAM COMPLETE  Result Date: 06/05/2023    ECHOCARDIOGRAM REPORT   Patient Name:   CHONG MAUSOLF Date of Exam: 06/05/2023 Medical Rec #:  [id number]   Height:       69.0 in Accession #:    [id number]  Weight:       211.6 lb Date of Birth:  11/14/1970  BSA:  2.116 m Patient Age:    51 years    BP:           163/93 mmHg Patient Gender: M           HR:           58 bpm. Exam Location:  Inpatient Procedure: 2D Echo, Cardiac Doppler and Color Doppler Indications:    Chest Pain R07.9  History:        Patient has prior history of Echocardiogram examinations and                 Patient has no prior history of Echocardiogram examinations.                 Previous Myocardial Infarction; Risk Factors:Hypertension.  Sonographer:    Harriette Bouillon RDCS Referring Phys: [id number] Cyndi Bender IMPRESSIONS  1. Left ventricular ejection fraction, by estimation, is 50 to 55%. The left ventricle has low normal function. The left ventricle has no regional wall motion abnormalities. There is mild left ventricular hypertrophy. Left ventricular diastolic parameters were normal.  2. Right ventricular systolic function is normal. The right ventricular size is normal. Tricuspid regurgitation signal is inadequate for assessing PA pressure.  3. The mitral valve is normal in structure. Trivial mitral valve regurgitation. No evidence of mitral stenosis.  4. The aortic valve is grossly  normal. There is mild calcification of the aortic valve. Aortic valve regurgitation is not visualized. No aortic stenosis is present.  5. The inferior vena cava is normal in size with greater than 50% respiratory variability, suggesting right atrial pressure of 3 mmHg. FINDINGS  Left Ventricle: Left ventricular ejection fraction, by estimation, is 50 to 55%. The left ventricle has low normal function. The left ventricle has no regional wall motion abnormalities. The left ventricular internal cavity size was normal in size. There is mild left ventricular hypertrophy. Left ventricular diastolic parameters were normal. Right Ventricle: The right ventricular size is normal. No increase in right ventricular wall thickness. Right ventricular systolic function is normal. Tricuspid regurgitation signal is inadequate for assessing PA pressure. Left Atrium: Left atrial size was normal in size. Right Atrium: Right atrial size was normal in size. Pericardium: Trivial pericardial effusion is present. Mitral Valve: The mitral valve is normal in structure. Trivial mitral valve regurgitation. No evidence of mitral valve stenosis. Tricuspid Valve: The tricuspid valve is normal in structure. Tricuspid valve regurgitation is trivial. No evidence of tricuspid stenosis. Aortic Valve: The aortic valve is grossly normal. There is mild calcification of the aortic valve. Aortic valve regurgitation is not visualized. No aortic stenosis is present. Pulmonic Valve: The pulmonic valve was normal in structure. Pulmonic valve regurgitation is not visualized. No evidence of pulmonic stenosis. Aorta: The aortic root is normal in size and structure. Venous: The inferior vena cava is normal in size with greater than 50% respiratory variability, suggesting right atrial pressure of 3 mmHg. IAS/Shunts: The atrial septum is grossly normal.  LEFT VENTRICLE PLAX 2D LVIDd:         5.10 cm   Diastology LVIDs:         3.80 cm   LV e' medial:    7.94 cm/s LV  PW:         1.20 cm   LV E/e' medial:  11.8 LV IVS:        1.20 cm   LV e' lateral:   10.70 cm/s LVOT diam:     2.20 cm   LV E/e'  lateral: 8.8 LV SV:         73 LV SV Index:   35 LVOT Area:     3.80 cm  RIGHT VENTRICLE             IVC RV S prime:     13.10 cm/s  IVC diam: 1.70 cm TAPSE (M-mode): 2.3 cm LEFT ATRIUM             Index        RIGHT ATRIUM           Index LA diam:        4.20 cm 1.98 cm/m   RA Area:     14.20 cm LA Vol (A2C):   66.6 ml 31.47 ml/m  RA Volume:   35.50 ml  16.77 ml/m LA Vol (A4C):   54.9 ml 25.94 ml/m LA Biplane Vol: 62.1 ml 29.34 ml/m  AORTIC VALVE LVOT Vmax:   90.70 cm/s LVOT Vmean:  64.500 cm/s LVOT VTI:    0.193 m  AORTA Ao Root diam: 3.30 cm Ao Asc diam:  3.00 cm MITRAL VALVE MV Area (PHT): 4.33 cm    SHUNTS MV Decel Time: 175 msec    Systemic VTI:  0.19 m MV E velocity: 93.80 cm/s  Systemic Diam: 2.20 cm MV A velocity: 53.60 cm/s MV E/A ratio:  1.75 Weston Brass MD Electronically signed by Weston Brass MD Signature Date/Time: 06/05/2023/10:29:09 AM    Final    DG Chest 2 View  Result Date: 06/04/2023 CLINICAL DATA:  52 year old male with chest pain EXAM: CHEST - 2 VIEW COMPARISON:  03/18/2014, 01/07/2021 FINDINGS: Cardiomediastinal silhouette unchanged in size and contour. No evidence of central vascular congestion. No interlobular septal thickening. No pneumothorax or pleural effusion. Coarsened interstitial markings, with no confluent airspace disease. No acute displaced fracture. Degenerative changes of the spine. IMPRESSION: No active cardiopulmonary disease. Electronically Signed   By: Gilmer Mor D.O.   On: 06/04/2023 14:21    Cardiac Studies   2D echo (06/05/2023)  IMPRESSIONS     1. Left ventricular ejection fraction, by estimation, is 50 to 55%. The  left ventricle has low normal function. The left ventricle has no regional  wall motion abnormalities. There is mild left ventricular hypertrophy.  Left ventricular diastolic  parameters were normal.    2. Right ventricular systolic function is normal. The right ventricular  size is normal. Tricuspid regurgitation signal is inadequate for assessing  PA pressure.   3. The mitral valve is normal in structure. Trivial mitral valve  regurgitation. No evidence of mitral stenosis.   4. The aortic valve is grossly normal. There is mild calcification of the  aortic valve. Aortic valve regurgitation is not visualized. No aortic  stenosis is present.   5. The inferior vena cava is normal in size with greater than 50%  respiratory variability, suggesting right atrial pressure of 3 mmHg.    Patient Profile     NOAR ALESSANDRO is a 52 y.o. male with a hx of CAD, hypertension, tobacco use,  who is being seen 06/04/2023 for the evaluation of chest pain at the request of Dr Nelson Chimes.   Assessment & Plan    1: Positive troponin-troponins went up to 60 and then fell to 39.  I think this is probably related to hypertensive urgency.  I do not think this is a "non-STEMI".  2: Hypertensive urgency-was not on medications prior to admission.  Began on carvedilol and losartan.  Blood pressures remain elevated.  Can titrate losartan.  3: Tobacco abuse, encourage cessation  4: Hyperlipidemia-LDL 125, currently not on statin therapy.  Await coronary CTA to determine how aggressive to be with further management.  2D echo performed today was normal.  Await coronary CTA.  If this is normal can be discharged home with outpatient follow-up of hypertension. [facility] will sign off.   Medication Recommendations: Continue carvedilol, titrate losartan Other recommendations (labs, testing, etc): None Follow up as an outpatient: We will arrange outpatient follow-up.  For questions or updates, please contact  [facility] Please consult [email or web address] for contact info under        Signed, Nanetta Batty, MD  06/05/2023, 10:38 AM

## 2023-06-05 NOTE — Progress Notes (Signed)
Echocardiogram 2D Echocardiogram has been performed.  Leda Roys RDCS 06/05/2023, 9:57 AM

## 2023-06-06 NOTE — Discharge Summary (Signed)
Physician Discharge Summary  Brett Odom PIR:[id number] DOB: 08/01/1971 DOA: 06/04/2023  PCP: Patient, No Pcp Per  Admit date: 06/04/2023 Discharge date: 06/06/2023  Admitted From: Home  Discharge disposition: Home   Recommendations for Outpatient Follow-Up:   Follow up with your primary care provider in one week.  Will need closer monitoring for adequate control of his blood pressure and adjustment of medications as necessary.. Check CBC, BMP, magnesium in the next visit Follow-up with cardiology as outpatient.   Discharge Diagnosis:   Principal Problem:   Hypertensive urgency Active Problems:   Demand ischemia   Tobacco use  Discharge Condition: Improved.  Diet recommendation: Low sodium, heart healthy.   Wound care: None.  Code status: Full.   History of Present Illness:   Brett Odom is a 52 y.o. 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  He had similar chest pain many years back and had left heart catheterization and angioplasty at [facility].  On this admission patient had elevated blood pressure on presentation and initial troponin was elevated at 60.  EKG showed some LVH changes.  Patient was started on heparin drip and cardiology was consulted   Hospital Course:   Following conditions were addressed during hospitalization as listed below,  Hypertensive urgency with chest pain Elevated troponin likely demand ischemia History of CAD Patient was monitored in telemetry without any arrhythmia.  Troponin was 60 followed by 39.  Chest x-ray was negative for acute findings.  Urine drug screen was negative.  Patient was initially on heparin drip which was discontinued subsequently.  Hemoglobin A1c was 5.0., lipid panel with LDL of 125.Marland Kitchen  TSH at 1.9.  Cardiology is consulted and patient was advised 2D  echocardiogram and coronary CTA.  Coronary CTA showed proximal LAD lesion which was not flow-limiting.  2D echo showed LV send fraction of 50 to 55% with no regional wall motion abnormality.  Discussed with cardiology Dr. Gery Pray who recommended okay for discharge home with outpatient cardiology follow-up.  Will continue Coreg losartan on discharge.  Will need to be followed up closely as outpatient for hypertensive management.   Tobacco use Counseling done.  He is motivated to quit.   Disposition.  At this time, patient is stable for disposition home with outpatient PCP and cardiology follow-up  Medical Consultants:   None.  Procedures:    CT coronary 2D echocardiogram. Subjective:   Today, patient was seen and examined at bedside.  Denies any shortness of breath or chest pain at the time of my evaluation.  Discharge Exam:   Vitals:   06/05/23 0405 06/05/23 0825  BP: (!) 163/93 (!) 172/96  Pulse: 60 (!) 53  Resp: 18 18  Temp: 98.1 F (36.7 C) 98.3 F (36.8 C)  SpO2: 98% 99%   Vitals:   06/04/23 1923 06/04/23 2325 06/05/23 0405 06/05/23 0825  BP:  (!) 155/97 (!) 163/93 (!) 172/96  Pulse:  75 60 (!) 53  Resp: 20 12 18 18   Temp: 98.1 F (36.7 C) 98.1 F (36.7 C) 98.1 F (36.7 C) 98.3 F (36.8 C)  TempSrc: Oral Oral Oral Oral  SpO2: 99% 97% 98% 99%  Weight:      Height:       Body mass index is 31.25 kg/m.  General: Alert awake, not in obvious distress, obese HENT: pupils equally reacting  to light,  No scleral pallor or icterus noted. Oral mucosa is moist.  Chest:  Clear breath sounds. No crackles or wheezes.  CVS: S1 &S2 heard. No murmur.  Regular rate and rhythm. Abdomen: Soft, nontender, nondistended.  Bowel sounds are heard.   Extremities: No cyanosis, clubbing or edema.  Peripheral pulses are palpable. Psych: Alert, awake and oriented, normal mood CNS:  No cranial nerve deficits.  Power equal in all extremities.   Skin: Warm and dry.  No rashes noted.  The  results of significant diagnostics from this hospitalization (including imaging, microbiology, ancillary and laboratory) are listed below for reference.     Diagnostic Studies:   CT CORONARY FRACTIONAL FLOW RESERVE FLUID ANALYSIS  Result Date: 06/05/2023 EXAM: FFRCT ANALYSIS FINDINGS: FFRct analysis was performed on the original cardiac CT angiogram dataset. Diagrammatic representation of the FFRct analysis is provided in a separate PDF document in PACS. This dictation was created using the PDF document and an interactive 3D model of the results. 3D model is not available in the EMR/PACS. Normal FFR range is >0.80. 1. Left Main: Normal 2. LAD: 0.97 proximal, 0.85 post proximal lesion (normal), 0.71 distal (from cumulative stenosis). D1 lesion which appears occluded on CT was not modeled. 3. LCX: 0.88 distally (lesion which may be severe on CT of distal circumflex just prior to PDA is not modeled. 4. Ramus: N/a 5. RCA: Normal IMPRESSION: 1. Proximal LAD lesion does not appear flow limiting. See above for details. Note: These examples are not recommendations of [employer] and only provided as examples of what other customers are doing. Electronically Signed   By: Donato Schultz M.D.   On: 06/05/2023 13:10   CT CORONARY MORPH W/CTA COR W/SCORE W/CA W/CM &/OR WO/CM  Result Date: 06/05/2023 CLINICAL DATA:  52 year old with elevated troponin. EXAM: Cardiac/Coronary  CTA TECHNIQUE: The patient was scanned on a [employer]. FINDINGS: A 120 kV prospective scan was triggered in the descending thoracic aorta at 111 HU's. Axial non-contrast 3 mm slices were carried out through the heart. The data set was analyzed on a dedicated work station and scored using the Agatson method. Gantry rotation speed was 250 msecs and collimation was .6 mm. 0.8 mg of sl NTG was given. The 3D data set was reconstructed in 5% intervals of the 67-82 % of the R-R cycle. Diastolic phases were analyzed on a dedicated work station using  MPR, MIP and VRT modes. The patient received 80 cc of contrast. Image quality: good Aorta:  Normal size.  No calcifications.  No dissection. Aortic Valve: No calcifications. Coronary Arteries:  Normal coronary origin.  Left dominance. RCA is a small non dominant artery. Left main is a large artery that gives rise to LAD and LCX arteries. LAD is a large vessel that has proximal moderate stenosis 50-69% mixed plaque. FFR 0.85 post lesion, non flow limiting. Mild diffuse disease distally. Distal LAD FFR 0.71 from cumulative stenosis. D1-moderate sized with early branching. First branch appears occluded proximally. This is not modeled by FFR. LCX is a large dominant artery giving rise to PDA. There is scattered calcified plaque. Possible moderate to severe stenosis distally prior to PDA 70-99%. Area not modeled by FFR. OM1-small OM2-moderate sized, distal calcified plaque. PDA -moderate sized Other findings: Normal pulmonary vein drainage into the left atrium. Normal left atrial appendage without a thrombus. Normal size of the pulmonary artery. Please see radiology report for non cardiac findings. IMPRESSION: 1. Coronary calcium score of 18. This was  73 percentile for age and sex matched control. 2. Total plaque volume (TPV) 207 mm3 which is 73rd percentile for age-and sex matched controls (calcified plaque 3 mm3; non-calcified plaque 204 mm3). TPV is moderate. 3. Normal coronary origin with Left dominance. 4. Moderate proximal LAD stenosis, small D1 possible occluded branch, possible distal circumflex severe stenosis 70-99% prior to PDA. Electronically Signed   By: Donato Schultz M.D.   On: 06/05/2023 13:04   ECHOCARDIOGRAM COMPLETE  Result Date: 06/05/2023    ECHOCARDIOGRAM REPORT   Patient Name:   LANARD ADAMCZYK Date of Exam: 06/05/2023 Medical Rec #:  [id number]   Height:       69.0 in Accession #:    [id number]  Weight:       211.6 lb Date of Birth:  1971/03/02  BSA:          2.116 m Patient Age:    51 years    BP:            163/93 mmHg Patient Gender: M           HR:           58 bpm. Exam Location:  Inpatient Procedure: 2D Echo, Cardiac Doppler and Color Doppler Indications:    Chest Pain R07.9  History:        Patient has prior history of Echocardiogram examinations and                 Patient has no prior history of Echocardiogram examinations.                 Previous Myocardial Infarction; Risk Factors:Hypertension.  Sonographer:    Harriette Bouillon RDCS Referring Phys: [id number] Cyndi Bender IMPRESSIONS  1. Left ventricular ejection fraction, by estimation, is 50 to 55%. The left ventricle has low normal function. The left ventricle has no regional wall motion abnormalities. There is mild left ventricular hypertrophy. Left ventricular diastolic parameters were normal.  2. Right ventricular systolic function is normal. The right ventricular size is normal. Tricuspid regurgitation signal is inadequate for assessing PA pressure.  3. The mitral valve is normal in structure. Trivial mitral valve regurgitation. No evidence of mitral stenosis.  4. The aortic valve is grossly normal. There is mild calcification of the aortic valve. Aortic valve regurgitation is not visualized. No aortic stenosis is present.  5. The inferior vena cava is normal in size with greater than 50% respiratory variability, suggesting right atrial pressure of 3 mmHg. FINDINGS  Left Ventricle: Left ventricular ejection fraction, by estimation, is 50 to 55%. The left ventricle has low normal function. The left ventricle has no regional wall motion abnormalities. The left ventricular internal cavity size was normal in size. There is mild left ventricular hypertrophy. Left ventricular diastolic parameters were normal. Right Ventricle: The right ventricular size is normal. No increase in right ventricular wall thickness. Right ventricular systolic function is normal. Tricuspid regurgitation signal is inadequate for assessing PA pressure. Left Atrium: Left atrial size was  normal in size. Right Atrium: Right atrial size was normal in size. Pericardium: Trivial pericardial effusion is present. Mitral Valve: The mitral valve is normal in structure. Trivial mitral valve regurgitation. No evidence of mitral valve stenosis. Tricuspid Valve: The tricuspid valve is normal in structure. Tricuspid valve regurgitation is trivial. No evidence of tricuspid stenosis. Aortic Valve: The aortic valve is grossly normal. There is mild calcification of the aortic valve. Aortic valve regurgitation is not visualized. No aortic stenosis is present. Pulmonic  Valve: The pulmonic valve was normal in structure. Pulmonic valve regurgitation is not visualized. No evidence of pulmonic stenosis. Aorta: The aortic root is normal in size and structure. Venous: The inferior vena cava is normal in size with greater than 50% respiratory variability, suggesting right atrial pressure of 3 mmHg. IAS/Shunts: The atrial septum is grossly normal.  LEFT VENTRICLE PLAX 2D LVIDd:         5.10 cm   Diastology LVIDs:         3.80 cm   LV e' medial:    7.94 cm/s LV PW:         1.20 cm   LV E/e' medial:  11.8 LV IVS:        1.20 cm   LV e' lateral:   10.70 cm/s LVOT diam:     2.20 cm   LV E/e' lateral: 8.8 LV SV:         73 LV SV Index:   35 LVOT Area:     3.80 cm  RIGHT VENTRICLE             IVC RV S prime:     13.10 cm/s  IVC diam: 1.70 cm TAPSE (M-mode): 2.3 cm LEFT ATRIUM             Index        RIGHT ATRIUM           Index LA diam:        4.20 cm 1.98 cm/m   RA Area:     14.20 cm LA Vol (A2C):   66.6 ml 31.47 ml/m  RA Volume:   35.50 ml  16.77 ml/m LA Vol (A4C):   54.9 ml 25.94 ml/m LA Biplane Vol: 62.1 ml 29.34 ml/m  AORTIC VALVE LVOT Vmax:   90.70 cm/s LVOT Vmean:  64.500 cm/s LVOT VTI:    0.193 m  AORTA Ao Root diam: 3.30 cm Ao Asc diam:  3.00 cm MITRAL VALVE MV Area (PHT): 4.33 cm    SHUNTS MV Decel Time: 175 msec    Systemic VTI:  0.19 m MV E velocity: 93.80 cm/s  Systemic Diam: 2.20 cm MV A velocity: 53.60 cm/s  MV E/A ratio:  1.75 Weston Brass MD Electronically signed by Weston Brass MD Signature Date/Time: 06/05/2023/10:29:09 AM    Final      Labs:   Basic Metabolic Panel: Recent Labs  Lab 06/04/23 1259 06/04/23 2342  NA 136 138  K 3.9 3.6  CL 103 108  CO2 18* 24  GLUCOSE 112* 104*  BUN 15 17  CREATININE 1.20 0.95  CALCIUM 9.2 9.0  MG  --  2.0   GFR Estimated Creatinine Clearance: 106.3 mL/min (by C-G formula based on SCr of 0.95 mg/dL). Liver Function Tests: No results for input(s): "AST", "ALT", "ALKPHOS", "BILITOT", "PROT", "ALBUMIN" in the last 168 hours. No results for input(s): "LIPASE", "AMYLASE" in the last 168 hours. No results for input(s): "AMMONIA" in the last 168 hours. Coagulation profile No results for input(s): "INR", "PROTIME" in the last 168 hours.  CBC: Recent Labs  Lab 06/04/23 1259 06/04/23 2342  WBC 11.1* 10.0  HGB 14.8 13.0  HCT 43.9 38.5*  MCV 98.9 96.3  PLT 300 262   Cardiac Enzymes: No results for input(s): "CKTOTAL", "CKMB", "CKMBINDEX", "TROPONINI" in the last 168 hours. BNP: Invalid input(s): "POCBNP" CBG: No results for input(s): "GLUCAP" in the last 168 hours. D-Dimer No results for input(s): "DDIMER" in the last 72 hours. Hgb A1c  Recent Labs    06/04/23 1811  HGBA1C 5.0   Lipid Profile Recent Labs    06/04/23 1811  CHOL 191  HDL 45  LDLCALC 125*  TRIG 103  CHOLHDL 4.2   Thyroid function studies Recent Labs    06/04/23 1812  TSH 1.900   Anemia work up No results for input(s): "VITAMINB12", "FOLATE", "FERRITIN", "TIBC", "IRON", "RETICCTPCT" in the last 72 hours. Microbiology No results found for this or any previous visit (from the past 240 hour(s)).   Discharge Instructions:   Discharge Instructions     Diet - low sodium heart healthy   Complete by: As directed    Discharge instructions   Complete by: As directed    Follow up with your primary care provider in one week.  Take medications as prescribed.   Stay on low salt diet.  Seek medical attention for worsening symptoms.  Your blood pressure medication might need to be adjusted as outpatient.  Follow-up with cardiology as outpatient.   Increase activity slowly   Complete by: As directed       Allergies as of 06/05/2023       Reactions   Codeine Nausea And Vomiting        Medication List     TAKE these medications    aspirin EC 81 MG tablet Take 1 tablet (81 mg total) by mouth daily. Swallow whole.   atorvastatin 40 MG tablet Commonly known as: LIPITOR Take 1 tablet (40 mg total) by mouth daily.   carvedilol 3.125 MG tablet Commonly known as: COREG Take 1 tablet (3.125 mg total) by mouth 2 (two) times daily with a meal.   losartan 50 MG tablet Commonly known as: COZAAR Take 1 tablet (50 mg total) by mouth daily.        Follow-up Information     Primary care provider Follow up in 1 week(s).                   Time coordinating discharge: 39 minutes  Signed:  Adeyemi Hamad  [facility] 06/06/2023, 2:54 PM

## 2023-06-13 NOTE — Progress Notes (Unsigned)
Cardiology Clinic Note   Date: 06/17/2023 ID: Brett Odom, DOB Feb 08, 1971, MRN [id number]  Primary Cardiologist:  Nanetta Batty, MD  Patient Profile    Brett Odom is a 52 y.o. male who presents to the clinic today for hospital follow up.     Past medical history significant for: CAD. LHC 2016 (NSTEMI) performed at [facility]: Balloon angioplasty to small vessels.  No stent placement.  Cath report not available. Coronary CTA with FFR 06/05/2023: Coronary calcium score 18 (73rd percentile).  Moderate proximal LAD stenosis, small D1 possible occluded branch, possible distal Cx severe stenosis 70 to 99% prior to PDA.  FFR performed showing proximal LAD lesion not flow-limiting. Demand ischemia. Echo 06/05/2023: EF 50 to 55%.  No RWMA.  Mild LVH.  Normal diastolic parameters.  Normal RV function.  Trivial MR.  Mild calcification of aortic valve without stenosis. Hypertension. Hyperlipidemia. Lipid panel 06/04/2023: LDL 125, HDL 45, TG 103, total 191. Tobacco abuse.     History of Present Illness    Brett Odom was first evaluated by Dr. Allyson Sabal on 06/04/2023 for chest pain during hospital admission.  Patient was previously followed by cardiology in [facility] and Dr. Tomie China for history of NSTEMI in 2016 for which she underwent balloon angioplasty to 2 small vessels but no stent placement.  He reports his cholesterol and thyroid function were good and shortly after stopped taking cardiac meds and was discharged from cardiology 6 months later.  Has not followed up since.  He presented to the ED on 06/04/2023 with complaints of chest pain radiating to left arm and left jaw relieved with NTG x 1.  He was hypertensive upon arrival.  He reported symptoms anginal equivalent.  EKG without ischemic changes.  Troponin 60>> 39.  Coronary CTA with FFR showed proximal LAD lesion not flow-limiting.  It was felt elevated troponin secondary to demand ischemia in the setting of hypertensive urgency.  Patient  discharged on 06/05/2023 with aspirin, atorvastatin, carvedilol, losartan.  Today, patient is doing well. 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 Patient denies shortness of breath or dyspnea on exertion. No chest pain, pressure, or tightness. Denies lower extremity edema, orthopnea, or PND. No palpitations.  He is working on quitting smoking. He was smoking 1 to 1 1/2 packs a day and has smoked 1/2 pack the last couple of days. He reports smoking increases when he has to work long hours. He is also working on improving his diet and restricting sodium. He has a grandson on the way and is motivated to make positive changes. Discussed testing performed at the hospital in detail. All questions answered.     ROS: All other systems reviewed and are otherwise negative except as noted in History of Present Illness.  Studies Reviewed      EKG is not ordered today.         Physical Exam    VS:  BP 136/64   Pulse 78   Ht 5\' 9"  (1.753 m)   Wt 208 lb 9.6 oz (94.6 kg)   SpO2 98%   BMI 30.80 kg/m  , BMI Body mass index is 30.8 kg/m.  GEN: Well nourished, well developed, in no acute distress. Neck: No JVD or carotid bruits. Cardiac:  RRR. No murmurs. No rubs or gallops.   Respiratory:  Respirations regular and unlabored. Clear to auscultation without rales, wheezing or rhonchi. GI: Soft, nontender, nondistended. Extremities: Radials/DP/PT 2+ and equal bilaterally. No clubbing or  cyanosis. No edema.  Skin: Warm and dry, no rash. Neuro: Strength intact.  Assessment & Plan    CAD.  History of NSTEMI with balloon angioplasty  2016 performed at [facility] (no cath report available).  Coronary CTA with FFR August 2024 showed proximal LAD lesion not flow-limiting. Patient denies chest pain, pressure, tightness. Continue aspirin, atorvastatin, carvedilol. Demand ischemia.  Occurring in the setting of hypertensive urgency.  Echo August 2024 showed low normal LV function, normal RV function, no  RWMA, LVH.  Patient denies lower extremity edema, DOE, orthopnea or PND. Euvolemic and well compensated on exam.  Continue carvedilol.  Hypertension: BP today 136/64. Patient denies headaches, dizziness or vision changes. Continue carvedilol, losartan. Hyperlipidemia.  LDL August 2024 125, not at goal.  Continue atorvastatin. Tobacco abuse. Patient continues to smoke and is working on cutting back. He was smoking 1 to 1 1/2 packs per day. In the last 2 days he has only smoked 1/2 pack. He tends to smoke more when he works long hours. He is congratulated on his effort and encouraged to continue cutting back until complete cessation.   Disposition: Return in 3 months or sooner as needed.          Signed, Brett Grandchild. Camila Maita, DNP, NP-C

## 2023-06-17 ENCOUNTER — Encounter: Payer: Self-pay | Admitting: Student

## 2023-06-17 ENCOUNTER — Ambulatory Visit: Payer: BLUE CROSS/BLUE SHIELD | Attending: Student | Admitting: Student

## 2023-06-17 VITALS — BP 136/64 | HR 78 | Ht 69.0 in | Wt 208.6 lb

## 2023-06-17 DIAGNOSIS — I2510 Atherosclerotic heart disease of native coronary artery without angina pectoris: Secondary | ICD-10-CM

## 2023-06-17 DIAGNOSIS — I10 Essential (primary) hypertension: Secondary | ICD-10-CM

## 2023-06-17 DIAGNOSIS — I2489 Other forms of acute ischemic heart disease: Secondary | ICD-10-CM | POA: Diagnosis not present

## 2023-06-17 DIAGNOSIS — E785 Hyperlipidemia, unspecified: Secondary | ICD-10-CM

## 2023-06-17 DIAGNOSIS — Z720 Tobacco use: Secondary | ICD-10-CM

## 2023-06-17 MED ORDER — ATORVASTATIN CALCIUM 40 MG PO TABS: 40.0000 mg | ORAL_TABLET | Freq: Every day | ORAL | 3 refills | Status: DC

## 2023-06-17 MED ORDER — LOSARTAN POTASSIUM 50 MG PO TABS: 50.0000 mg | ORAL_TABLET | Freq: Every day | ORAL | 3 refills | Status: DC

## 2023-06-17 MED ORDER — CARVEDILOL 3.125 MG PO TABS: 3.1250 mg | ORAL_TABLET | Freq: Two times a day (BID) | ORAL | 3 refills | Status: DC

## 2023-06-17 NOTE — Patient Instructions (Signed)
Medication Instructions:  *If you need a refill on your cardiac medications before your next appointment, please call your pharmacy*   Lab Work: If you have labs (blood work) drawn today and your tests are completely normal, you will receive your results only by: MyChart Message (if you have MyChart) OR A paper copy in the mail If you have any lab test that is abnormal or we need to change your treatment, we will call you to review the results.  Follow-Up: At [facility], you and your health needs are our priority.  As part of our continuing mission to provide you with exceptional heart care, we have created designated Provider Care Teams.  These Care Teams include your primary Cardiologist (physician) and Advanced Practice Providers (APPs -  Physician Assistants and Nurse Practitioners) who all work together to provide you with the care you need, when you need it.  We recommend signing up for the patient portal called "MyChart".  Sign up information is provided on this After Visit Summary.  MyChart is used to connect with patients for Virtual Visits (Telemedicine).  Patients are able to view lab/test results, encounter notes, upcoming appointments, etc.  Non-urgent messages can be sent to your provider as well.   To learn more about what you can do with MyChart, go to [email or web address].    Your next appointment:   6 month(s)  Provider:   Nanetta Batty, MD A letter will be mailed to you as a reminder to call the office for your follow up appointment.

## 2023-12-14 ENCOUNTER — Other Ambulatory Visit: Payer: Self-pay | Admitting: Student

## 2023-12-30 ENCOUNTER — Other Ambulatory Visit: Payer: Self-pay

## 2023-12-30 ENCOUNTER — Emergency Department (HOSPITAL_COMMUNITY): Admission: EM | Admit: 2023-12-30 | Discharge: 2023-12-30 | Disposition: A | Discharge status: Home / Self Care

## 2023-12-30 ENCOUNTER — Emergency Department (HOSPITAL_COMMUNITY)

## 2023-12-30 ENCOUNTER — Encounter (HOSPITAL_COMMUNITY): Payer: Self-pay | Admitting: Emergency Medicine

## 2023-12-30 DIAGNOSIS — N201 Calculus of ureter: Secondary | ICD-10-CM

## 2023-12-30 DIAGNOSIS — N132 Hydronephrosis with renal and ureteral calculous obstruction: Secondary | ICD-10-CM | POA: Diagnosis not present

## 2023-12-30 DIAGNOSIS — Z79899 Other long term (current) drug therapy: Secondary | ICD-10-CM | POA: Diagnosis not present

## 2023-12-30 DIAGNOSIS — Z7982 Long term (current) use of aspirin: Secondary | ICD-10-CM | POA: Diagnosis not present

## 2023-12-30 DIAGNOSIS — R109 Unspecified abdominal pain: Secondary | ICD-10-CM | POA: Diagnosis present

## 2023-12-30 DIAGNOSIS — I10 Essential (primary) hypertension: Secondary | ICD-10-CM | POA: Insufficient documentation

## 2023-12-30 LAB — URINALYSIS, ROUTINE W REFLEX MICROSCOPIC
Bilirubin Urine: NEGATIVE
Glucose, UA: NEGATIVE mg/dL
Ketones, ur: NEGATIVE mg/dL
Nitrite: NEGATIVE
Protein, ur: NEGATIVE mg/dL
Specific Gravity, Urine: 1.013 (ref 1.005–1.030)
WBC, UA: >50 WBC/hpf (ref 0–5)
pH: 5 (ref 5.0–8.0)

## 2023-12-30 LAB — BASIC METABOLIC PANEL
Anion gap: 9 (ref 5–15)
BUN: 16 mg/dL (ref 6–20)
CO2: 18 mmol/L — ABNORMAL LOW (ref 22–32)
Calcium: 9.3 mg/dL (ref 8.9–10.3)
Chloride: 105 mmol/L (ref 98–111)
Creatinine, Ser: 0.97 mg/dL (ref 0.61–1.24)
GFR, Estimated: >60 mL/min (ref >60)
Glucose, Bld: 154 mg/dL — ABNORMAL HIGH (ref 70–99)
Potassium: 3.8 mmol/L (ref 3.5–5.1)
Sodium: 132 mmol/L — ABNORMAL LOW (ref 135–145)

## 2023-12-30 LAB — CBC
HCT: 44.6 % (ref 39.0–52.0)
Hemoglobin: 15.5 g/dL (ref 13.0–17.0)
MCH: 32.7 pg (ref 26.0–34.0)
MCHC: 34.8 g/dL (ref 30.0–36.0)
MCV: 94.1 fL (ref 80.0–100.0)
Platelets: 255 10*3/uL (ref 150–400)
RBC: 4.74 MIL/uL (ref 4.22–5.81)
RDW: 14.2 % (ref 11.5–15.5)
WBC: 8.1 10*3/uL (ref 4.0–10.5)
nRBC: 0 % (ref 0.0–0.2)

## 2023-12-30 MED ORDER — OXYCODONE-ACETAMINOPHEN 5-325 MG PO TABS
1.0000 | ORAL_TABLET | Freq: Four times a day (QID) | ORAL | 0 refills | Status: AC | PRN
Start: 2023-12-30 — End: ?

## 2023-12-30 MED ORDER — SODIUM CHLORIDE 0.9 % IV SOLN: 1.0000 g | Freq: Once | INTRAVENOUS | Status: DC

## 2023-12-30 MED ORDER — HYDROMORPHONE HCL 1 MG/ML IJ SOLN
1.0000 mg | Freq: Once | INTRAMUSCULAR | Status: AC
  Administered 2023-12-30: 1 mg via INTRAVENOUS
  Filled 2023-12-30: qty 1

## 2023-12-30 MED ORDER — ONDANSETRON 4 MG PO TBDP: 4.0000 mg | ORAL_TABLET | Freq: Three times a day (TID) | ORAL | 0 refills | Status: AC | PRN

## 2023-12-30 MED ORDER — KETOROLAC TROMETHAMINE 15 MG/ML IJ SOLN
15.0000 mg | Freq: Once | INTRAMUSCULAR | Status: AC
  Administered 2023-12-30: 15 mg via INTRAVENOUS
  Filled 2023-12-30: qty 1

## 2023-12-30 MED ORDER — LACTATED RINGERS IV BOLUS
1000.0000 mL | Freq: Once | INTRAVENOUS | Status: AC
  Administered 2023-12-30: 1000 mL via INTRAVENOUS

## 2023-12-30 MED ORDER — TAMSULOSIN HCL 0.4 MG PO CAPS
0.4000 mg | ORAL_CAPSULE | Freq: Once | ORAL | Status: AC
  Administered 2023-12-30: 0.4 mg via ORAL
  Filled 2023-12-30: qty 1

## 2023-12-30 MED ORDER — OXYCODONE-ACETAMINOPHEN 5-325 MG PO TABS
2.0000 | ORAL_TABLET | Freq: Once | ORAL | Status: AC
  Administered 2023-12-30: 2 via ORAL
  Filled 2023-12-30: qty 2

## 2023-12-30 MED ORDER — CEPHALEXIN 500 MG PO CAPS: 500.0000 mg | ORAL_CAPSULE | Freq: Four times a day (QID) | ORAL | 0 refills | Status: AC

## 2023-12-30 MED ORDER — ONDANSETRON HCL 4 MG/2ML IJ SOLN
4.0000 mg | Freq: Once | INTRAMUSCULAR | Status: AC
  Administered 2023-12-30: 4 mg via INTRAVENOUS
  Filled 2023-12-30: qty 2

## 2023-12-30 MED ORDER — KETOROLAC TROMETHAMINE 10 MG PO TABS: 10.0000 mg | ORAL_TABLET | Freq: Four times a day (QID) | ORAL | 0 refills | Status: AC | PRN

## 2023-12-30 MED ORDER — ONDANSETRON 4 MG PO TBDP
4.0000 mg | ORAL_TABLET | Freq: Once | ORAL | Status: AC
  Administered 2023-12-30: 4 mg via ORAL
  Filled 2023-12-30: qty 1

## 2023-12-30 MED ORDER — TAMSULOSIN HCL 0.4 MG PO CAPS: 0.4000 mg | ORAL_CAPSULE | Freq: Every day | ORAL | 0 refills | Status: AC

## 2023-12-30 NOTE — ED Triage Notes (Signed)
 Pt reports left-sided flank pain since Friday. Increased pain since 0300 this morning that wraps around ABD. Hx of kidney stones. Denies any other GU symptoms.

## 2023-12-30 NOTE — ED Provider Notes (Signed)
 [facility] EMERGENCY DEPARTMENT AT [facility] Provider Note   CSN: [id number] Arrival date & time: 12/30/23  1323     History  Chief Complaint  Patient presents with   Flank Pain    Brett Odom is a 53 y.o. male.  53 year old male with past medical history of ureterolithiasis and hypertension presenting to the emergency department today with left flank pain.  Patient states has been going now since this morning about 2 AM.  He states he has been having some intermittent discomfort that was not so bad over the past few days.  He states that this morning he woke up was having severe pain.  He came to the ER today due to this.  He has had nausea but denies any vomiting.  Denies any associated fevers.  He denies any focal weakness, numbness, or tingling.   Flank Pain       Home Medications Prior to Admission medications   Medication Sig Start Date End Date Taking? Authorizing Provider  acetaminophen (TYLENOL) 500 MG tablet Take 500 mg by mouth every 6 (six) hours as needed for mild pain (pain score 1-3).   Yes [provider]  aspirin EC 81 MG tablet Take 1 tablet (81 mg total) by mouth daily. Swallow whole. 06/06/23 06/05/24 Yes Pokhrel, Laxman, MD  atorvastatin (LIPITOR) 40 MG tablet Take 1 tablet (40 mg total) by mouth daily. 06/17/23 12/30/23 Yes Wittenborn, Deborah, NP  carvedilol (COREG) 3.125 MG tablet TAKE 1 TABLET BY MOUTH TWICE A DAY WITH A MEAL 12/15/23  Yes Wittenborn, Deborah, NP  ketorolac (TORADOL) 10 MG tablet Take 1 tablet (10 mg total) by mouth every 6 (six) hours as needed. 12/30/23  Yes Durwin Glaze, MD  losartan (COZAAR) 50 MG tablet Take 1 tablet (50 mg total) by mouth daily. 06/17/23 06/16/24 Yes Wittenborn, Deborah, NP  ondansetron (ZOFRAN-ODT) 4 MG disintegrating tablet Take 1 tablet (4 mg total) by mouth every 8 (eight) hours as needed for nausea or vomiting. 12/30/23  Yes Durwin Glaze, MD  oxyCODONE-acetaminophen (PERCOCET/ROXICET) 5-325 MG tablet  Take 1 tablet by mouth every 6 (six) hours as needed for severe pain (pain score 7-10). 12/30/23  Yes Durwin Glaze, MD  tamsulosin (FLOMAX) 0.4 MG CAPS capsule Take 1 capsule (0.4 mg total) by mouth daily. 12/30/23  Yes Durwin Glaze, MD      Allergies    Codeine    Review of Systems   Review of Systems  Gastrointestinal:  Positive for nausea.  Genitourinary:  Positive for flank pain.  All other systems reviewed and are negative.   Physical Exam Updated Vital Signs BP (!) 161/109   Pulse 64   Temp 97.8 F (36.6 C) (Oral)   Resp 18   Ht 5\' 9"  (1.753 m)   Wt 97.5 kg   SpO2 99%   BMI 31.75 kg/m  Physical Exam Vitals and nursing note reviewed.   Gen: Appears uncomfortable Eyes: PERRL, EOMI HEENT: no oropharyngeal swelling Neck: trachea midline Resp: clear to auscultation bilaterally Card: RRR, no murmurs, rubs, or gallops Abd: nontender, nondistended, left-sided flank tenderness noted Extremities: no calf tenderness, no edema Vascular: 2+ radial pulses bilaterally, 2+ DP pulses bilaterally Skin: no rashes Psyc: acting appropriately   ED Results / Procedures / Treatments   Labs (all labs ordered are listed, but only abnormal results are displayed) Labs Reviewed  BASIC METABOLIC PANEL - Abnormal; Notable for the following components:      Result Value  Sodium 132 (*)    CO2 18 (*)    Glucose, Bld 154 (*)    All other components within normal limits  URINALYSIS, ROUTINE W REFLEX MICROSCOPIC - Abnormal; Notable for the following components:   APPearance HAZY (*)    Hgb urine dipstick MODERATE (*)    Leukocytes,Ua LARGE (*)    Bacteria, UA RARE (*)    All other components within normal limits  URINE CULTURE  CBC    EKG None  Radiology CT RENAL STONE STUDY Result Date: 12/30/2023 CLINICAL DATA:  Abdominal and flank pain. EXAM: CT ABDOMEN AND PELVIS WITHOUT CONTRAST TECHNIQUE: Multidetector CT imaging of the abdomen and pelvis was performed following the standard  protocol without IV contrast. RADIATION DOSE REDUCTION: This exam was performed according to the departmental dose-optimization program which includes automated exposure control, adjustment of the mA and/or kV according to patient size and/or use of iterative reconstruction technique. COMPARISON:  None Available. FINDINGS: Lower chest: No acute findings. Hepatobiliary: No mass visualized on this unenhanced exam. Gallbladder is unremarkable. No evidence of biliary ductal dilatation. Pancreas: No mass or inflammatory process visualized on this unenhanced exam. Spleen:  Within normal limits in size. Adrenals/Urinary tract: Mild right hydronephrosis, with 10 mm calculus in the right renal pelvis. No ureteral calculi or dilatation. Unremarkable unopacified urinary bladder. Stomach/Bowel: No evidence of obstruction, inflammatory process, or abnormal fluid collections. Normal appendix visualized. Vascular/Lymphatic: No pathologically enlarged lymph nodes identified. No evidence of abdominal aortic aneurysm. Reproductive:  No mass or other significant abnormality. Other:  None. Musculoskeletal:  No suspicious bone lesions identified. IMPRESSION: Mild right hydronephrosis, with 10 mm calculus in right renal pelvis. Electronically Signed   By: Danae Orleans M.D.   On: 12/30/2023 17:02    Procedures Procedures    Medications Ordered in ED Medications  cefTRIAXone (ROCEPHIN) 1 g in sodium chloride 0.9 % 100 mL IVPB (has no administration in time range)  oxyCODONE-acetaminophen (PERCOCET/ROXICET) 5-325 MG per tablet 2 tablet (2 tablets Oral Given 12/30/23 1413)  ondansetron (ZOFRAN-ODT) disintegrating tablet 4 mg (4 mg Oral Given 12/30/23 1413)  HYDROmorphone (DILAUDID) injection 1 mg (1 mg Intravenous Given 12/30/23 1820)  ondansetron (ZOFRAN) injection 4 mg (4 mg Intravenous Given 12/30/23 1820)  HYDROmorphone (DILAUDID) injection 1 mg (1 mg Intravenous Given 12/30/23 2028)  lactated ringers bolus 1,000 mL (0 mLs  Intravenous Stopped 12/30/23 2241)  ketorolac (TORADOL) 15 MG/ML injection 15 mg (15 mg Intravenous Given 12/30/23 2115)  tamsulosin (FLOMAX) capsule 0.4 mg (0.4 mg Oral Given 12/30/23 2254)    ED Course/ Medical Decision Making/ A&P                                 Medical Decision Making 53 year old male with past medical history of hypertension and ureterolithiasis in the past presenting to emergency department today with left flank pain.  The patient's blood pressure is significantly elevated here.  He is otherwise neurovascularly intact.  CT scan from triage does show a large ureteral stone with some hydronephrosis.  I give the patient Dilaudid and Zofran for symptoms and reevaluate.  It is 10 mm a very well may require admission.  The patient's labs here are largely unremarkable.  The patient does have some leukocytes in his urine but no fever.  I suspect that this is likely secondary to his stone and less likely due to infection.  I discussed this with Dr. Marlou Porch and he felt the  patient could go home with or without antibiotics but did request that a urine culture is added.  This is added here.  The patient is discharged with return precautions.  Amount and/or Complexity of Data Reviewed Labs: ordered.  Risk Prescription drug management.           Final Clinical Impression(s) / ED Diagnoses Final diagnoses:  Ureterolithiasis    Rx / DC Orders ED Discharge Orders          Ordered    oxyCODONE-acetaminophen (PERCOCET/ROXICET) 5-325 MG tablet  Every 6 hours PRN        12/30/23 2246    ketorolac (TORADOL) 10 MG tablet  Every 6 hours PRN        12/30/23 2246    ondansetron (ZOFRAN-ODT) 4 MG disintegrating tablet  Every 8 hours PRN        12/30/23 2246    tamsulosin (FLOMAX) 0.4 MG CAPS capsule  Daily        12/30/23 2246              Durwin Glaze, MD 12/30/23 2259

## 2023-12-30 NOTE — Discharge Instructions (Signed)
 Your CT scan shows a large kidney stone.  I did call discuss this with our urologist.  Please try the medications as prescribed.  I would start with the ketorolac as needed for pain.  If you are still having pain take the Percocet.  Take Zofran as needed for nausea.  Take the Flomax daily as well and drink plenty of fluids.  If you have worsening symptoms please go to [facility] emergency department.  Please call Dr. Jasmine Awe office tomorrow.  He is going to reach out to his office to try to schedule lithotripsy for Friday.  Return to the ER for fevers or worsening symptoms.

## 2023-12-30 NOTE — ED Provider Triage Note (Signed)
 Emergency Medicine Provider Triage Evaluation Note  Brett Odom , a 53 y.o. male  was evaluated in triage.  Pt complains of  L flank pain x 5 days nagging and intermittent. Became severe ant 3 am, worsening, nausea no vomiting. 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  Appropriate orders placed.  Brett Odom was informed that the remainder of the evaluation will be completed by another provider, this initial triage assessment does not replace that evaluation, and the importance of remaining in the ED until their evaluation is complete.     Arthor Captain, PA-C 12/30/23 1409

## 2023-12-31 ENCOUNTER — Telehealth: Payer: Self-pay | Admitting: *Deleted

## 2023-12-31 NOTE — Telephone Encounter (Signed)
 Pt was referred to urology that does not accept his insurance; requested referral be sent to urology office within his network.  RNCM sent to [facility] as requested.

## 2024-01-02 LAB — URINE CULTURE
Culture: 80000 — AB
Special Requests: NORMAL

## 2024-01-03 ENCOUNTER — Telehealth (HOSPITAL_BASED_OUTPATIENT_CLINIC_OR_DEPARTMENT_OTHER): Payer: Self-pay | Admitting: *Deleted

## 2024-01-03 NOTE — Telephone Encounter (Signed)
 Post ED Visit - Positive Culture Follow-up: Successful Patient Follow-Up  Culture assessed and recommendations reviewed by:  []  Enzo Bi, Pharm.D. []  Celedonio Miyamoto, Pharm.D., BCPS AQ-ID []  Garvin Fila, Pharm.D., BCPS []  Georgina Pillion, [address].D., BCPS []  [place], [address].D., BCPS, AAHIVP []  Estella Husk, Pharm.D., BCPS, AAHIVP []  Lysle Pearl, PharmD, BCPS []  Phillips Climes, PharmD, BCPS []  Agapito Games, PharmD, BCPS [x]  Estill Batten, PharmD  Positive urine culture  []  Patient discharged without antimicrobial prescription and treatment is now indicated [x]  Organism is resistant to prescribed ED discharge antimicrobial []  Patient with positive blood cultures  Changes discussed with ED provider: Marita Kansas, PA-C New antibiotic prescription Linezolid 600mg  PO BID x 7 days Called to [facility] [place], [place]  Contacted patient, date 01/03/24 , time 1115   Brett Odom 01/03/2024, 11:14 AM

## 2024-01-03 NOTE — Progress Notes (Signed)
 ED Antimicrobial Stewardship Positive Culture Follow Up   Brett Odom is an 53 y.o. male who presented to [facility] with a chief complaint of  Chief Complaint  Patient presents with   Flank Pain    Recent Results (from the past 720 hours)  Urine Culture (for pregnant, neutropenic or urologic patients or patients with an indwelling urinary catheter)     Status: Abnormal   Collection Time: 12/30/23 10:59 PM   Specimen: Urine, Clean Catch  Result Value Ref Range Status   Specimen Description URINE, CLEAN CATCH  Final   Special Requests   Final    Normal Performed at [facility], [address] [place], [place] [address]    Culture (A)  Final    80,000 COLONIES/mL METHICILLIN RESISTANT STAPHYLOCOCCUS AUREUS   Report Status 01/02/2024 FINAL  Final   Organism ID, Bacteria METHICILLIN RESISTANT STAPHYLOCOCCUS AUREUS (A)  Final      Susceptibility   Methicillin resistant staphylococcus aureus - MIC*    CIPROFLOXACIN >=8 RESISTANT Resistant     GENTAMICIN <=0.5 SENSITIVE Sensitive     NITROFURANTOIN <=16 SENSITIVE Sensitive     OXACILLIN >=4 RESISTANT Resistant     TETRACYCLINE <=1 SENSITIVE Sensitive     VANCOMYCIN 1 SENSITIVE Sensitive     TRIMETH/SULFA >=320 RESISTANT Resistant     RIFAMPIN <=0.5 SENSITIVE Sensitive     Inducible Clindamycin NEGATIVE Sensitive     LINEZOLID 2 SENSITIVE Sensitive     * 80,000 COLONIES/mL METHICILLIN RESISTANT STAPHYLOCOCCUS AUREUS    Treated with Keflex, organism resistant to prescribed antimicrobial  New antibiotic prescription: Linezolid 600mg  PO q12h x 7 days. Has urology f/u scheduled for 3/11 with [facility]. Will f/u with pharmacy in the afternoon to ensure price.   ED Provider: Marita Kansas, PA-C    Estill Batten, PharmD, BCCCP  01/03/2024, 10:56 AM Clinical Pharmacist Monday - Friday phone -  [phone] Saturday - Sunday phone - [phone]

## 2024-06-10 ENCOUNTER — Telehealth: Payer: Self-pay | Admitting: Cardiovascular Disease

## 2024-06-10 ENCOUNTER — Other Ambulatory Visit: Payer: Self-pay

## 2024-06-10 MED ORDER — ATORVASTATIN CALCIUM 40 MG PO TABS: 40.0000 mg | ORAL_TABLET | Freq: Every day | ORAL | 0 refills | Status: DC

## 2024-06-10 MED ORDER — CARVEDILOL 3.125 MG PO TABS: 3.1250 mg | ORAL_TABLET | Freq: Two times a day (BID) | ORAL | 0 refills | Status: DC

## 2024-06-10 MED ORDER — LOSARTAN POTASSIUM 50 MG PO TABS: 50.0000 mg | ORAL_TABLET | Freq: Every day | ORAL | 0 refills | Status: DC

## 2024-06-10 NOTE — Telephone Encounter (Signed)
*  STAT* If patient is at the pharmacy, call can be transferred to refill team.   1. Which medications need to be refilled? (please list name of each medication and dose if known)   atorvastatin  (LIPITOR) 40 MG tablet (Expired)  carvedilol  (COREG ) 3.125 MG tablet  losartan  (COZAAR ) 50 MG tablet   2. Would you like to learn more about the convenience, safety, & potential cost savings by using the [facility]?   3. Are you open to using the [facility] (Type [facility]. ).  4. Which pharmacy/location (including street and city if local pharmacy) is medication to be sent to?  [id number] [place], [place] - [address]   5. Do they need a 30 day or 90 day supply?   Patient stated he still has some medication.  Patient has an appointment scheduled on 8/22 with K. Devora, NP.

## 2024-06-16 NOTE — Progress Notes (Unsigned)
 Cardiology Office Note    Date:  06/18/2024  ID:  Brett Odom, DOB 1971/05/27, MRN [id number] PCP:  Patient, No Pcp Per  Cardiologist:  Dorn Lesches, MD  Electrophysiologist:  None   Chief Complaint: Follow up for CAD   History of Present Illness: .    [name] is a 53 y.o. male with visit-pertinent history of CAD s/p LHC in 2016 in setting of NSTEMI with balloon angioplasty of small vessels, no stent placement, coronary CTA in 05/2023 with coronary calcium  score of 18, moderate proximal LAD stenosis, possible distal CX severe stenosis 79 9% prior to PDA, FFR performed showing proximal LAD lesion not flow-limiting, demand ischemia, hypertension, hyperlipidemia, tobacco use.  Patient first evaluated by Dr. Lesches on 06/04/2023 for chest pain during hospital admission.  Patient was previously followed by cardiology at [facility] and Dr. Edwyna for history of NSTEMI in 2016 for which she underwent balloon angioplasty to 2 small vessels with no stent placement at that time.  Patient presented the ED on 06/04/2023 with complaints of chest pain radiating to the left arm and left jaw, received nitroglycerin  x 1.  He was hypertensive on arrival.  He reported symptoms of anginal equivalent.  EKG without ischemic changes troponin 60>>39.  Coronary CTA with FFR showed proximal LAD lesion not flow-limiting.  Was felt elevated troponin secondary demand ischemia in setting of hypertensive urgency.  Patient was discharged on 06/05/2023 with aspirin , atorvastatin , carvedilol  and losartan .  Patient was last seen in clinic on 06/17/2023 and was doing well.  It was recommended that patient return to clinic in 3 months.  Today he presents for follow-up.  He reports that he has been doing well overall. He denies chest pain,, tightness or pressure.  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  He denies any shortness of  breath, lower extremity edema, orthopnea or PND.  He denies any palpitations, presyncope or syncope.  Patient notes that he was to follow-up many months ago however had a kidney stone and was traveling and forgot to reschedule his appointments.  He presents today in order to obtain refills of his medications. ROS: .   Today he denies chest pain, shortness of breath, lower extremity edema, fatigue, palpitations, melena, hematuria, hemoptysis, diaphoresis, weakness, presyncope, syncope, orthopnea, and PND.  All other systems are reviewed and otherwise negative. Studies Reviewed: SABRA   EKG:  EKG is ordered today, personally reviewed, demonstrating  EKG Interpretation Date/Time:  Friday June 18 2024 09:19:27 EDT Ventricular Rate:  62 PR Interval:  174 QRS Duration:  104 QT Interval:  390 QTC Calculation: 395 R Axis:   28  Text Interpretation: Normal sinus rhythm Left ventricular hypertrophy When compared with ECG of 04-Jun-2023 12:45, Nonspecific T wave abnormality has replaced inverted T waves in Inferior leads Confirmed by Narcissus Detwiler [id number]) on 06/18/2024 10:15:31 AM   CV Studies: Cardiac studies reviewed are outlined and summarized above. Otherwise please see EMR for full report. Cardiac Studies & Procedures   ______________________________________________________________________________________________     ECHOCARDIOGRAM  ECHOCARDIOGRAM COMPLETE 06/05/2023  Narrative ECHOCARDIOGRAM REPORT    Patient Name:   Brett Odom Date of Exam: 06/05/2023 Medical Rec #:  [id number]   Height:       69.0 in Accession #:    [id number]  Weight:       211.6 lb Date of Birth:  Jan 23, 1971  BSA:  2.116 m Patient Age:    51 years    BP:           163/93 mmHg Patient Gender: M           HR:           58 bpm. Exam Location:  Inpatient  Procedure: 2D Echo, Cardiac Doppler and Color Doppler  Indications:    Chest Pain R07.9  History:        Patient has prior history of Echocardiogram  examinations and Patient has no prior history of Echocardiogram examinations. Previous Myocardial Infarction; Risk Factors:Hypertension.  Sonographer:    Tinnie Gosling RDCS Referring Phys: [id number] XIKA ZHAO  IMPRESSIONS   1. Left ventricular ejection fraction, by estimation, is 50 to 55%. The left ventricle has low normal function. The left ventricle has no regional wall motion abnormalities. There is mild left ventricular hypertrophy. Left ventricular diastolic parameters were normal. 2. Right ventricular systolic function is normal. The right ventricular size is normal. Tricuspid regurgitation signal is inadequate for assessing PA pressure. 3. The mitral valve is normal in structure. Trivial mitral valve regurgitation. No evidence of mitral stenosis. 4. The aortic valve is grossly normal. There is mild calcification of the aortic valve. Aortic valve regurgitation is not visualized. No aortic stenosis is present. 5. The inferior vena cava is normal in size with greater than 50% respiratory variability, suggesting right atrial pressure of 3 mmHg.  FINDINGS Left Ventricle: Left ventricular ejection fraction, by estimation, is 50 to 55%. The left ventricle has low normal function. The left ventricle has no regional wall motion abnormalities. The left ventricular internal cavity size was normal in size. There is mild left ventricular hypertrophy. Left ventricular diastolic parameters were normal.  Right Ventricle: The right ventricular size is normal. No increase in right ventricular wall thickness. Right ventricular systolic function is normal. Tricuspid regurgitation signal is inadequate for assessing PA pressure.  Left Atrium: Left atrial size was normal in size.  Right Atrium: Right atrial size was normal in size.  Pericardium: Trivial pericardial effusion is present.  Mitral Valve: The mitral valve is normal in structure. Trivial mitral valve regurgitation. No evidence of mitral valve  stenosis.  Tricuspid Valve: The tricuspid valve is normal in structure. Tricuspid valve regurgitation is trivial. No evidence of tricuspid stenosis.  Aortic Valve: The aortic valve is grossly normal. There is mild calcification of the aortic valve. Aortic valve regurgitation is not visualized. No aortic stenosis is present.  Pulmonic Valve: The pulmonic valve was normal in structure. Pulmonic valve regurgitation is not visualized. No evidence of pulmonic stenosis.  Aorta: The aortic root is normal in size and structure.  Venous: The inferior vena cava is normal in size with greater than 50% respiratory variability, suggesting right atrial pressure of 3 mmHg.  IAS/Shunts: The atrial septum is grossly normal.   LEFT VENTRICLE PLAX 2D LVIDd:         5.10 cm   Diastology LVIDs:         3.80 cm   LV e' medial:    7.94 cm/s LV PW:         1.20 cm   LV E/e' medial:  11.8 LV IVS:        1.20 cm   LV e' lateral:   10.70 cm/s LVOT diam:     2.20 cm   LV E/e' lateral: 8.8 LV SV:         73 LV SV Index:   35 LVOT  Area:     3.80 cm   RIGHT VENTRICLE             IVC RV S prime:     13.10 cm/s  IVC diam: 1.70 cm TAPSE (M-mode): 2.3 cm  LEFT ATRIUM             Index        RIGHT ATRIUM           Index LA diam:        4.20 cm 1.98 cm/m   RA Area:     14.20 cm LA Vol (A2C):   66.6 ml 31.47 ml/m  RA Volume:   35.50 ml  16.77 ml/m LA Vol (A4C):   54.9 ml 25.94 ml/m LA Biplane Vol: 62.1 ml 29.34 ml/m AORTIC VALVE LVOT Vmax:   90.70 cm/s LVOT Vmean:  64.500 cm/s LVOT VTI:    0.193 m  AORTA Ao Root diam: 3.30 cm Ao Asc diam:  3.00 cm  MITRAL VALVE MV Area (PHT): 4.33 cm    SHUNTS MV Decel Time: 175 msec    Systemic VTI:  0.19 m MV E velocity: 93.80 cm/s  Systemic Diam: 2.20 cm MV A velocity: 53.60 cm/s MV E/A ratio:  1.75  Soyla Merck MD Electronically signed by Soyla Merck MD Signature Date/Time: 06/05/2023/10:29:09 AM    Final      CT SCANS  CT CORONARY MORPH  W/CTA COR W/SCORE 06/05/2023  Addendum 06/12/2023  1:31 AM ADDENDUM REPORT: 06/12/2023 01:29  EXAM: OVER-READ INTERPRETATION  CT CHEST  The following report is an over-read performed by radiologist Dr. Oneil Devonshire of [facility] on 06/12/2023. This over-read does not include interpretation of cardiac or coronary anatomy or pathology. The coronary calcium  score/coronary CTA interpretation by the cardiologist is attached.  COMPARISON:  None.  FINDINGS: Cardiovascular: There are no significant extracardiac vascular findings.  Mediastinum/Nodes: There are no enlarged lymph nodes within the visualized mediastinum.  Lungs/Pleura: There is no pleural effusion. The visualized lungs appear clear.  Upper abdomen: No significant findings in the visualized upper abdomen.  Musculoskeletal/Chest wall: No chest wall mass or suspicious osseous findings within the visualized chest.  IMPRESSION: No significant extracardiac findings within the visualized chest.   Electronically Signed By: Oneil Devonshire M.D. On: 06/12/2023 01:29  Narrative CLINICAL DATA:  53 year old with elevated troponin.  EXAM: Cardiac/Coronary  CTA  TECHNIQUE: The patient was scanned on a [employer].  FINDINGS: A 120 kV prospective scan was triggered in the descending thoracic aorta at 111 HU's. Axial non-contrast 3 mm slices were carried out through the heart. The data set was analyzed on a dedicated work station and scored using the Agatson method. Gantry rotation speed was 250 msecs and collimation was .6 mm. 0.8 mg of sl NTG was given. The 3D data set was reconstructed in 5% intervals of the 67-82 % of the R-R cycle. Diastolic phases were analyzed on a dedicated work station using MPR, MIP and VRT modes. The patient received 80 cc of contrast.  Image quality: good  Aorta:  Normal size.  No calcifications.  No dissection.  Aortic Valve: No calcifications.  Coronary Arteries:   Normal coronary origin.  Left dominance.  RCA is a small non dominant artery.  Left main is a large artery that gives rise to LAD and LCX arteries.  LAD is a large vessel that has proximal moderate stenosis 50-69% mixed plaque. FFR 0.85 post lesion, non flow limiting. Mild diffuse disease  distally. Distal LAD FFR 0.71 from cumulative stenosis.  D1-moderate sized with early branching. First branch appears occluded proximally. This is not modeled by FFR.  LCX is a large dominant artery giving rise to PDA. There is scattered calcified plaque. Possible moderate to severe stenosis distally prior to PDA 70-99%. Area not modeled by FFR.  OM1-small  OM2-moderate sized, distal calcified plaque.  PDA -moderate sized  Other findings:  Normal pulmonary vein drainage into the left atrium.  Normal left atrial appendage without a thrombus.  Normal size of the pulmonary artery.  Please see radiology report for non cardiac findings.  IMPRESSION: 1. Coronary calcium  score of 18. This was 26 percentile for age and sex matched control.  2. Total plaque volume (TPV) 207 mm3 which is 73rd percentile for age-and sex matched controls (calcified plaque 3 mm3; non-calcified plaque 204 mm3). TPV is moderate.  3. Normal coronary origin with Left dominance.  4. Moderate proximal LAD stenosis, small D1 possible occluded branch, possible distal circumflex severe stenosis 70-99% prior to PDA.  Electronically Signed: By: Oneil Parchment M.D. On: 06/05/2023 13:04     ______________________________________________________________________________________________       Current Reported Medications:.    Current Meds  Medication Sig   acetaminophen  (TYLENOL ) 500 MG tablet Take 500 mg by mouth every 6 (six) hours as needed for mild pain (pain score 1-3).   cephALEXin  (KEFLEX ) 500 MG capsule Take 1 capsule (500 mg total) by mouth 4 (four) times daily.   [DISCONTINUED] atorvastatin  (LIPITOR) 40 MG  tablet Take 1 tablet (40 mg total) by mouth daily.   [DISCONTINUED] carvedilol  (COREG ) 3.125 MG tablet Take 1 tablet (3.125 mg total) by mouth 2 (two) times daily with a meal. Please keep upcoming appointment with your Cardiologist on 8/22 in order to receive future refills. Thank You.   [DISCONTINUED] losartan  (COZAAR ) 50 MG tablet Take 1 tablet (50 mg total) by mouth daily.    Physical Exam:    VS:  BP 128/74   Pulse 62   Ht 5' 9 (1.753 m)   Wt 206 lb 9.6 oz (93.7 kg)   SpO2 97%   BMI 30.51 kg/m    Wt Readings from Last 3 Encounters:  06/18/24 206 lb 9.6 oz (93.7 kg)  12/30/23 215 lb (97.5 kg)  06/17/23 208 lb 9.6 oz (94.6 kg)    GEN: Well nourished, well developed in no acute distress NECK: No JVD; No carotid bruits CARDIAC: RRR, no murmurs, rubs, gallops RESPIRATORY:  Clear to auscultation without rales, wheezing or rhonchi  ABDOMEN: Soft, non-tender, non-distended EXTREMITIES:  No edema; No acute deformity     Asessement and Plan:.    CAD: History of NSTEMI with balloon angioplasty in 2016, no cath report available.  Coronary CTA with FFR in August 2024 showed approximately the lesion not flow-limiting.  Today he reports that he is been doing very well, denies any chest pain or shortness of breath.  Patient reports he does a very physically demanding job outside, tolerates very well, denies any exertional symptoms.Heart healthy diet and regular cardiovascular exercise encouraged.  Reviewed ED precautions. Check CBC, CMET and fasting lipid profile.  Continue aspirin  81 mg daily, Lipitor 40 mg daily, carvedilol  3.125 mg twice daily, losartan  50 mg daily.  Demand ischemia: Noted to be in setting of hypertensive urgency.  Echo in August 2024 showed low normal LV function, normal RV function, no RWMA, LVH.  Patient denies any further chest pain or shortness of breath.  He reports he  is doing very well.  Continue carvedilol  3.125 mg twice daily and losartan  50 mg daily.  Hypertension:  Blood pressure today 128/74.  Continue current antihypertensive regimen.  Hyperlipidemia: Last LDL 125 on 06/04/2023, following was started on atorvastatin  40 mg daily.  Check fasting lipid profile and CMET.  Tobacco abuse: Patient reports that he smokes a pack a day, complete cessation encouraged.  He is not interested at this time.   Disposition: F/u with Dr. Court in five months, patient notes he will be out of town in six months.   Signed, Tamkia Temples D Kalany Diekmann, NP

## 2024-06-18 ENCOUNTER — Other Ambulatory Visit (HOSPITAL_COMMUNITY): Payer: Self-pay

## 2024-06-18 ENCOUNTER — Encounter: Payer: Self-pay | Admitting: Cardiology

## 2024-06-18 ENCOUNTER — Ambulatory Visit: Attending: Cardiology | Admitting: Cardiology

## 2024-06-18 VITALS — BP 128/74 | HR 62 | Ht 69.0 in | Wt 206.6 lb

## 2024-06-18 DIAGNOSIS — E785 Hyperlipidemia, unspecified: Secondary | ICD-10-CM | POA: Diagnosis not present

## 2024-06-18 DIAGNOSIS — I2489 Other forms of acute ischemic heart disease: Secondary | ICD-10-CM

## 2024-06-18 DIAGNOSIS — Z720 Tobacco use: Secondary | ICD-10-CM

## 2024-06-18 DIAGNOSIS — I2510 Atherosclerotic heart disease of native coronary artery without angina pectoris: Secondary | ICD-10-CM | POA: Diagnosis not present

## 2024-06-18 DIAGNOSIS — I10 Essential (primary) hypertension: Secondary | ICD-10-CM | POA: Diagnosis not present

## 2024-06-18 MED ORDER — ATORVASTATIN CALCIUM 40 MG PO TABS
40.0000 mg | ORAL_TABLET | Freq: Every day | ORAL | 0 refills | Status: DC
  Filled 2024-06-18: qty 90, 90d supply, fill #0

## 2024-06-18 MED ORDER — LOSARTAN POTASSIUM 50 MG PO TABS
50.0000 mg | ORAL_TABLET | Freq: Every day | ORAL | 0 refills | Status: DC
  Filled 2024-06-18: qty 90, 90d supply, fill #0

## 2024-06-18 MED ORDER — CARVEDILOL 3.125 MG PO TABS
3.1250 mg | ORAL_TABLET | Freq: Two times a day (BID) | ORAL | 0 refills | Status: DC
  Filled 2024-06-18: qty 180, 90d supply, fill #0

## 2024-06-18 NOTE — Patient Instructions (Signed)
 Medication Instructions:  No changes *If you need a refill on your cardiac medications before your next appointment, please call your pharmacy*  Lab Work: We are going to ned a fastin lipid panel, Cmet, and CBC If you have labs (blood work) drawn today and your tests are completely normal, you will receive your results only by: MyChart Message (if you have MyChart) OR A paper copy in the mail If you have any lab test that is abnormal or we need to change your treatment, we will call you to review the results.  Testing/Procedures: No testing  Follow-Up: At [facility], you and your health needs are our priority.  As part of our continuing mission to provide you with exceptional heart care, our providers are all part of one team.  This team includes your primary Cardiologist (physician) and Advanced Practice Providers or APPs (Physician Assistants and Nurse Practitioners) who all work together to provide you with the care you need, when you need it.  Your next appointment:   5 month(s)  Provider:   Dorn Lesches, MD    We recommend signing up for the patient portal called MyChart.  Sign up information is provided on this After Visit Summary.  MyChart is used to connect with patients for Virtual Visits (Telemedicine).  Patients are able to view lab/test results, encounter notes, upcoming appointments, etc.  Non-urgent messages can be sent to your provider as well.   To learn more about what you can do with MyChart, go to [email or web address].

## 2024-07-10 ENCOUNTER — Other Ambulatory Visit: Payer: Self-pay | Admitting: Student
